# Patient Record
Sex: Female | Born: 1940 | Race: White | Hispanic: No | Smoking: Former smoker
Health system: Southern US, Community
[De-identification: ages and names within clinical notes are randomized; demographics above are authoritative.]

## PROBLEM LIST (undated history)

## (undated) DIAGNOSIS — M1990 Unspecified osteoarthritis, unspecified site: Secondary | ICD-10-CM

## (undated) DIAGNOSIS — B1910 Unspecified viral hepatitis B without hepatic coma: Secondary | ICD-10-CM

## (undated) DIAGNOSIS — I10 Essential (primary) hypertension: Secondary | ICD-10-CM

## (undated) DIAGNOSIS — K7460 Unspecified cirrhosis of liver: Secondary | ICD-10-CM

## (undated) HISTORY — PX: TOTAL HIP ARTHROPLASTY: SHX124

## (undated) HISTORY — PX: CHOLECYSTECTOMY: SHX55

---

## 2019-05-14 ENCOUNTER — Other Ambulatory Visit: Payer: Self-pay | Admitting: Family Medicine

## 2019-05-14 DIAGNOSIS — E2839 Other primary ovarian failure: Secondary | ICD-10-CM

## 2019-08-19 ENCOUNTER — Other Ambulatory Visit: Payer: Self-pay

## 2020-01-07 ENCOUNTER — Other Ambulatory Visit: Payer: Self-pay

## 2020-03-12 ENCOUNTER — Other Ambulatory Visit: Payer: Self-pay

## 2020-04-23 ENCOUNTER — Emergency Department (HOSPITAL_COMMUNITY): Payer: Medicare Other

## 2020-04-23 ENCOUNTER — Emergency Department (HOSPITAL_COMMUNITY)
Admission: EM | Admit: 2020-04-23 | Discharge: 2020-04-23 | Disposition: A | Discharge status: Home / Self Care | Payer: Medicare Other | Attending: Emergency Medicine | Admitting: Emergency Medicine

## 2020-04-23 ENCOUNTER — Encounter (HOSPITAL_COMMUNITY): Payer: Self-pay

## 2020-04-23 ENCOUNTER — Emergency Department (HOSPITAL_BASED_OUTPATIENT_CLINIC_OR_DEPARTMENT_OTHER): Payer: Medicare Other

## 2020-04-23 ENCOUNTER — Other Ambulatory Visit: Payer: Self-pay

## 2020-04-23 DIAGNOSIS — R609 Edema, unspecified: Secondary | ICD-10-CM

## 2020-04-23 DIAGNOSIS — Z87891 Personal history of nicotine dependence: Secondary | ICD-10-CM | POA: Insufficient documentation

## 2020-04-23 DIAGNOSIS — I10 Essential (primary) hypertension: Secondary | ICD-10-CM | POA: Insufficient documentation

## 2020-04-23 DIAGNOSIS — M79604 Pain in right leg: Secondary | ICD-10-CM | POA: Insufficient documentation

## 2020-04-23 DIAGNOSIS — Z96641 Presence of right artificial hip joint: Secondary | ICD-10-CM | POA: Diagnosis not present

## 2020-04-23 HISTORY — DX: Unspecified viral hepatitis B without hepatic coma: B19.10

## 2020-04-23 HISTORY — DX: Unspecified osteoarthritis, unspecified site: M19.90

## 2020-04-23 HISTORY — DX: Unspecified cirrhosis of liver: K74.60

## 2020-04-23 HISTORY — DX: Essential (primary) hypertension: I10

## 2020-04-23 NOTE — ED Triage Notes (Signed)
Patient c/o lateral right thigh pain x 4 days. Patient states that she went to her PCP yesterday and was given medication for arthritis. Patient also reports slight swelling to lateral right knee and below. Patient states, "I have pain in this one vein."

## 2020-04-23 NOTE — Discharge Instructions (Addendum)
The chest xray did not show infection.  The ultrasound did not show a blood clot.  It is possible the pain is caused from arthritis. Continue medication for primary care doctor. You can also try the over the counter pain patches or Voltaren gel for your knee pain. Follow up with primary care doctor within 1 week if you continue to have pain.  You can follow up with orthopedics and vascular if needed.  Return to the emergency department today if symptoms worsen

## 2020-04-23 NOTE — ED Provider Notes (Signed)
[facility]-EMERGENCY DEPT Provider Note   CSN: [id number] Arrival date & time: 04/23/20  1309     History Chief Complaint  Patient presents with  . Leg Pain    Melissa Fletcher is a 79 y.o. female with past medical history significant for arthritis, cirrhosis, hepatitis B, hypertension.  Takes baby aspirin daily. Surgical history includes total right hip arthroplasty in [place].  Had COVID vaccinations.  HPI Patient presents to emergency room today with chief complaint of right leg pain x4 days.  Patient states the pain has progressively worsened and became constant today.  She is describing the pain as a pressure sensation.  She states the pain is worse with ambulation. Pain radiates down her leg. She states she feels like the pain is in her veins.  She went to PCP yesterday for leg pain and was told she has arthritis and given medication for it.  She does not remember what that medication was.  She states despite taking that her symptoms have not improved.  She is rating the pain 8 of 10 in severity.  She did take Tylenol prior to arrival with symptom improvement. She denies any fall or injury to the leg. She states she had a flight to [place] x 1 month ago, no other prolonged periods of immobilization. Denies history of PE or DVT, fever, chills, cough, hemoptysis, chest pain, lower extremity edema, numbness, tingling, weakness.  Patient's daughter is at the bedside and provides additional history.  She states patient called her today and said she was having shortness of breath and difficulty breathing along with leg pain.  She was asking to be evaluated in the hospital.  Patient is denying any shortness of breath currently.  She states she was told when her pain is severe to take deep breaths and so she was doing earlier today.      Past Medical History:  Diagnosis Date  . Arthritis   . Cirrhosis (HCC)   . Hepatitis B   . Hypertension     There are no  problems to display for this patient.   Past Surgical History:  Procedure Laterality Date  . CHOLECYSTECTOMY    . TOTAL HIP ARTHROPLASTY Right      OB History   No obstetric history on file.     History reviewed. No pertinent family history.  Social History   Tobacco Use  . Smoking status: Former Games developermoker  . Smokeless tobacco: Never Used  Vaping Use  . Vaping Use: Never used  Substance Use Topics  . Alcohol use: Never  . Drug use: Never    Home Medications Prior to Admission medications   Not on File    Allergies    Patient has no known allergies.  Review of Systems   Review of Systems All other systems are reviewed and are negative for acute change except as noted in the HPI.  Physical Exam Updated Vital Signs BP (!) 179/72   Pulse (!) 59   Temp 97.9 F (36.6 C) (Oral)   Resp (!) 22   Ht 5\' 2"  (1.575 m)   Wt 80.3 kg   SpO2 92%   BMI 32.37 kg/m   Physical Exam Vitals and nursing note reviewed.  Constitutional:      General: She is not in acute distress.    Appearance: She is not ill-appearing.  HENT:     Head: Normocephalic and atraumatic.     Right Ear: Tympanic membrane and external ear normal.  Left Ear: Tympanic membrane and external ear normal.     Nose: Nose normal.     Mouth/Throat:     Mouth: Mucous membranes are moist.     Pharynx: Oropharynx is clear.  Eyes:     General: No scleral icterus.       Right eye: No discharge.        Left eye: No discharge.     Extraocular Movements: Extraocular movements intact.     Conjunctiva/sclera: Conjunctivae normal.     Pupils: Pupils are equal, round, and reactive to light.  Neck:     Vascular: No JVD.  Cardiovascular:     Rate and Rhythm: Normal rate and regular rhythm.     Pulses: Normal pulses.          Radial pulses are 2+ on the right side and 2+ on the left side.       Dorsalis pedis pulses are 2+ on the right side and 2+ on the left side.     Heart sounds: Normal heart sounds.    Pulmonary:     Comments: Lungs clear to auscultation in all fields. Symmetric chest rise. No wheezing, rales, or rhonchi.  Respiratory rate 20 during exam Abdominal:     Comments: Abdomen is soft, non-distended, and non-tender in all quadrants. No rigidity, no guarding. No peritoneal signs.  Musculoskeletal:        General: Normal range of motion.     Cervical back: Normal range of motion.     Right lower leg: No edema.     Left lower leg: No edema.     Comments: Tender to palpation over lateral joint line. No deformity of right lower extremity.   Skin:    General: Skin is warm and dry.     Capillary Refill: Capillary refill takes less than 2 seconds.     Comments: Equal tactile temperature to all extremities  Neurological:     Mental Status: She is oriented to person, place, and time.     GCS: GCS eye subscore is 4. GCS verbal subscore is 5. GCS motor subscore is 6.     Comments: Fluent speech, no facial droop.  Psychiatric:        Behavior: Behavior normal.     ED Results / Procedures / Treatments   Labs (all labs ordered are listed, but only abnormal results are displayed) Labs Reviewed - No data to display  EKG None  Radiology DG Chest Portable 1 View  Result Date: 04/23/2020 CLINICAL DATA:  Shortness of breath.  Right thigh pain. EXAM: PORTABLE CHEST 1 VIEW COMPARISON:  None. FINDINGS: The heart size and mediastinal contours are within normal limits. Both lungs are clear. The visualized skeletal structures are unremarkable. IMPRESSION: No active disease. Electronically Signed   By: Gerome Sam III M.D   On: 04/23/2020 16:27   VAS [place] LOWER EXTREMITY VENOUS (DVT) (ONLY [facility] & [facility])  Result Date: 04/23/2020  Lower Venous DVT Study Indications: Edema.  Comparison Study: no prior Performing Technologist: Blanch Media RVS  Examination Guidelines: A complete evaluation includes B-mode imaging, spectral Doppler, color Doppler, and power Doppler as needed of all accessible  portions of each vessel. Bilateral testing is considered an integral part of a complete examination. Limited examinations for reoccurring indications may be performed as noted. The reflux portion of the exam is performed with the patient in reverse Trendelenburg.  +---------+---------------+---------+-----------+----------+--------------+ RIGHT    CompressibilityPhasicitySpontaneityPropertiesThrombus Aging +---------+---------------+---------+-----------+----------+--------------+ CFV      Full  Yes      Yes                                 +---------+---------------+---------+-----------+----------+--------------+ SFJ      Full                                                        +---------+---------------+---------+-----------+----------+--------------+ FV Prox  Full                                                        +---------+---------------+---------+-----------+----------+--------------+ FV Mid   Full                                                        +---------+---------------+---------+-----------+----------+--------------+ FV DistalFull                                                        +---------+---------------+---------+-----------+----------+--------------+ PFV      Full                                                        +---------+---------------+---------+-----------+----------+--------------+ POP      Full           Yes      Yes                                 +---------+---------------+---------+-----------+----------+--------------+ PTV      Full                                                        +---------+---------------+---------+-----------+----------+--------------+ PERO     Full                                                        +---------+---------------+---------+-----------+----------+--------------+   +----+---------------+---------+-----------+----------+--------------+  LEFTCompressibilityPhasicitySpontaneityPropertiesThrombus Aging +----+---------------+---------+-----------+----------+--------------+ CFV Full           Yes      Yes                                 +----+---------------+---------+-----------+----------+--------------+     Summary: RIGHT: - There is no evidence of deep vein  thrombosis in the lower extremity.  - No cystic structure found in the popliteal fossa.  LEFT: - No evidence of common femoral vein obstruction.  *See table(s) above for measurements and observations.    Preliminary     Procedures Procedures (including critical care time)  Medications Ordered in ED Medications - No data to display  ED Course  I have reviewed the triage vital signs and the nursing notes.  Pertinent labs & imaging results that were available during my care of the patient were reviewed by me and considered in my medical decision making (see chart for details).    MDM Rules/Calculators/A&P                          History provided by patient and daughter  with additional history obtained from chart review.    79 yo female presenting with right leg pain without known injury. Afebrile, HDS, noted to be tachypneic in triage with RR 32 documented.  On my exam she is well appearing, in no acute distress. RR 20, no tachypnea and lungs CTAB. She has no edema noted to RLE, is ambulatory with normal gait. No deformity of leg, tender to palpation to lateral joint line. Looks to have varicose veins on RLE. DP pulse 2+ bilaterally. Brisk cap refill. I viewed pt's chest xray and it does not suggest acute infectious processes.  Ultrasound is negative for DVT.  Discussed results with patient and daughter. Vitals checked again and she continues to have normal respiratory rate. Recommend she follow up with ortho and vascular for varicose veins if she continues to have pain. Also recommend pcp follow up within 1 week.  The patient appears reasonably screened  and/or stabilized for discharge and I doubt any other medical condition or other [facility] requiring further screening, evaluation, or treatment in the ED at this time prior to discharge. The patient is safe for discharge with strict return precautions discussed. The patient was discussed with and seen by Dr. Madilyn Hook who agrees with the treatment plan.   Portions of this note were generated with Scientist, clinical (histocompatibility and immunogenetics). Dictation errors may occur despite best attempts at proofreading.   Final Clinical Impression(s) / ED Diagnoses Final diagnoses:  Right leg pain    Rx / DC Orders ED Discharge Orders    None       Kandice Hams 04/23/20 1736    Tilden Fossa, MD 04/26/20 [phone]

## 2020-04-23 NOTE — Progress Notes (Signed)
Lower extremity venous has been completed.   Preliminary results in CV Proc.   Blanch Media 04/23/2020 4:34 PM

## 2020-09-23 ENCOUNTER — Emergency Department (HOSPITAL_COMMUNITY): Payer: Medicare Other

## 2020-09-23 ENCOUNTER — Other Ambulatory Visit: Payer: Self-pay

## 2020-09-23 ENCOUNTER — Emergency Department (HOSPITAL_COMMUNITY)
Admission: EM | Admit: 2020-09-23 | Discharge: 2020-09-23 | Disposition: A | Discharge status: Home / Self Care | Payer: Medicare Other | Attending: Emergency Medicine | Admitting: Emergency Medicine

## 2020-09-23 ENCOUNTER — Encounter (HOSPITAL_COMMUNITY): Payer: Self-pay

## 2020-09-23 DIAGNOSIS — I10 Essential (primary) hypertension: Secondary | ICD-10-CM | POA: Insufficient documentation

## 2020-09-23 DIAGNOSIS — Z96641 Presence of right artificial hip joint: Secondary | ICD-10-CM | POA: Insufficient documentation

## 2020-09-23 DIAGNOSIS — M542 Cervicalgia: Secondary | ICD-10-CM | POA: Diagnosis not present

## 2020-09-23 DIAGNOSIS — Z87891 Personal history of nicotine dependence: Secondary | ICD-10-CM | POA: Insufficient documentation

## 2020-09-23 LAB — CBC WITH DIFFERENTIAL/PLATELET
Abs Immature Granulocytes: 0.03 10*3/uL (ref 0.00–0.07)
Basophils Absolute: 0 10*3/uL (ref 0.0–0.1)
Basophils Relative: 1 %
Eosinophils Absolute: 0.2 10*3/uL (ref 0.0–0.5)
Eosinophils Relative: 3 %
HCT: 38.2 % (ref 36.0–46.0)
Hemoglobin: 12.4 g/dL (ref 12.0–15.0)
Immature Granulocytes: 0 %
Lymphocytes Relative: 17 %
Lymphs Abs: 1.1 10*3/uL (ref 0.7–4.0)
MCH: 30.3 pg (ref 26.0–34.0)
MCHC: 32.5 g/dL (ref 30.0–36.0)
MCV: 93.4 fL (ref 80.0–100.0)
Monocytes Absolute: 0.9 10*3/uL (ref 0.1–1.0)
Monocytes Relative: 13 %
Neutro Abs: 4.5 10*3/uL (ref 1.7–7.7)
Neutrophils Relative %: 66 %
Platelets: 115 10*3/uL — ABNORMAL LOW (ref 150–400)
RBC: 4.09 MIL/uL (ref 3.87–5.11)
RDW: 12.5 % (ref 11.5–15.5)
WBC: 6.8 10*3/uL (ref 4.0–10.5)
nRBC: 0 % (ref 0.0–0.2)

## 2020-09-23 LAB — URINALYSIS, ROUTINE W REFLEX MICROSCOPIC
Bilirubin Urine: NEGATIVE
Glucose, UA: NEGATIVE mg/dL
Ketones, ur: NEGATIVE mg/dL
Nitrite: NEGATIVE
Protein, ur: NEGATIVE mg/dL
Specific Gravity, Urine: 1.01 (ref 1.005–1.030)
pH: 6 (ref 5.0–8.0)

## 2020-09-23 LAB — COMPREHENSIVE METABOLIC PANEL
ALT: 15 U/L (ref 0–44)
AST: 29 U/L (ref 15–41)
Albumin: 3.8 g/dL (ref 3.5–5.0)
Alkaline Phosphatase: 93 U/L (ref 38–126)
Anion gap: 7 (ref 5–15)
BUN: 11 mg/dL (ref 8–23)
CO2: 28 mmol/L (ref 22–32)
Calcium: 10.4 mg/dL — ABNORMAL HIGH (ref 8.9–10.3)
Chloride: 101 mmol/L (ref 98–111)
Creatinine, Ser: 0.8 mg/dL (ref 0.44–1.00)
GFR, Estimated: >60 mL/min (ref >60)
Glucose, Bld: 112 mg/dL — ABNORMAL HIGH (ref 70–99)
Potassium: 3.9 mmol/L (ref 3.5–5.1)
Sodium: 136 mmol/L (ref 135–145)
Total Bilirubin: 1 mg/dL (ref 0.3–1.2)
Total Protein: 8.6 g/dL — ABNORMAL HIGH (ref 6.5–8.1)

## 2020-09-23 LAB — TROPONIN I (HIGH SENSITIVITY)
Troponin I (High Sensitivity): 6 ng/L (ref <18)
Troponin I (High Sensitivity): 10 ng/L (ref <18)

## 2020-09-23 MED ORDER — DIPHENHYDRAMINE HCL 50 MG/ML IJ SOLN
12.5000 mg | Freq: Once | INTRAMUSCULAR | Status: AC
  Administered 2020-09-23: 12.5 mg via INTRAVENOUS
  Filled 2020-09-23: qty 1

## 2020-09-23 MED ORDER — MORPHINE SULFATE (PF) 4 MG/ML IV SOLN
4.0000 mg | Freq: Once | INTRAVENOUS | Status: AC
  Administered 2020-09-23: 4 mg via INTRAVENOUS
  Filled 2020-09-23: qty 1

## 2020-09-23 MED ORDER — LIDOCAINE 5 % EX PTCH: 1.0000 | MEDICATED_PATCH | CUTANEOUS | 0 refills | Status: DC

## 2020-09-23 MED ORDER — HYDRALAZINE HCL 20 MG/ML IJ SOLN
5.0000 mg | Freq: Once | INTRAMUSCULAR | Status: AC
  Administered 2020-09-23: 5 mg via INTRAVENOUS
  Filled 2020-09-23: qty 1

## 2020-09-23 MED ORDER — METHOCARBAMOL 500 MG PO TABS: 500.0000 mg | ORAL_TABLET | Freq: Two times a day (BID) | ORAL | 0 refills | Status: DC

## 2020-09-23 MED ORDER — IOHEXOL 350 MG/ML SOLN
75.0000 mL | Freq: Once | INTRAVENOUS | Status: AC | PRN
  Administered 2020-09-23: 75 mL via INTRAVENOUS

## 2020-09-23 MED ORDER — METOCLOPRAMIDE HCL 5 MG/ML IJ SOLN
10.0000 mg | Freq: Once | INTRAMUSCULAR | Status: AC
  Administered 2020-09-23: 10 mg via INTRAVENOUS
  Filled 2020-09-23: qty 2

## 2020-09-23 NOTE — ED Notes (Signed)
Lab notified and aware of Troponin add-on

## 2020-09-23 NOTE — ED Provider Notes (Signed)
[facility]-EMERGENCY DEPT Provider Note   CSN: [id number] Arrival date & time: 09/23/20  1427     History Chief Complaint  Patient presents with  . Shoulder Pain  . Back Pain  . Headache    Melissa Fletcher is a 80 y.o. female with PMH of HTN, cirrhosis, and arthritis presents to the ED with complaints of atraumatic left trapezial and left-sided neck pain.  On my examination, patient reports that she felt peripherally fine when she went to bed Sunday evening.  However, on Monday morning she woke up with severe left-sided neck throbbing "pressure" pain that has been constant.  She recently moved here from [place] 4 years ago.  She states that she had been previously diagnosed with a "bruised vessel" near her cervical spine when she had a CT head and neck obtained.    She states that she has diminished ability and pain with hyper extension of the neck.  She is still able to flex her neck forward and has relatively normal range of motion twisting her head in either direction.  She describes it as being in her trapezial/left cervical region and it radiates up the left side of her face and head posteriorly.  Her discomfort is worse with certain movements of the head, most notably with the hyperextension as well as with leftward (ipsilateral) gaze.  She has been trying to treat her symptoms with heating pads to the affected area, with no significant relief.  She denies any obvious precipitating trauma, fevers or chills, blurred vision or diplopia, numbness or weakness, room spinning dizziness, gait disturbance, recent falls, or any other symptoms.  Patient is left-hand dominant.  Denies any increased activity this weekend that could have precipitated her symptoms.  HPI     Past Medical History:  Diagnosis Date  . Arthritis   . Cirrhosis (HCC)   . Hepatitis B   . Hypertension     There are no problems to display for this patient.   Past Surgical History:   Procedure Laterality Date  . CHOLECYSTECTOMY    . TOTAL HIP ARTHROPLASTY Right      OB History   No obstetric history on file.     History reviewed. No pertinent family history.  Social History   Tobacco Use  . Smoking status: Former Games developer  . Smokeless tobacco: Never Used  Vaping Use  . Vaping Use: Never used  Substance Use Topics  . Alcohol use: Never  . Drug use: Never    Home Medications Prior to Admission medications   Not on File    Allergies    Patient has no known allergies.  Review of Systems   Review of Systems  All other systems reviewed and are negative.   Physical Exam Updated Vital Signs BP (!) 166/74 (BP Location: Left Arm)   Pulse 77   Temp 98.6 F (37 C) (Oral)   Resp 12   Ht 5\' 3"  (1.6 m)   Wt 74.8 kg   SpO2 98%   BMI 29.23 kg/m   Physical Exam Vitals and nursing note reviewed. Exam conducted with a chaperone present.  Constitutional:      Appearance: Normal appearance.  HENT:     Head: Normocephalic and atraumatic.  Eyes:     General: No scleral icterus.    Extraocular Movements: Extraocular movements intact.     Conjunctiva/sclera: Conjunctivae normal.     Pupils: Pupils are equal, round, and reactive to light.  Neck:  Comments: ROM limited.  Flexion intact.  Extension limited due to pain.  Left-sided neck discomfort also elicited with leftward gaze.  No significant discomfort with rightward gaze. Cardiovascular:     Rate and Rhythm: Normal rate.  Pulmonary:     Effort: Pulmonary effort is normal.  Musculoskeletal:     Cervical back: Tenderness present.  Skin:    General: Skin is dry.     Findings: No rash.     Comments: No overlying skin changes.  Neurological:     General: No focal deficit present.     Mental Status: She is alert and oriented to person, place, and time.     GCS: GCS eye subscore is 4. GCS verbal subscore is 5. GCS motor subscore is 6.     Cranial Nerves: No cranial nerve deficit.     Sensory: No  sensory deficit.     Motor: No weakness.     Coordination: Coordination normal.     Gait: Gait normal.     Comments: CN II through XII grossly intact.  Moves all extremities with strength intact against resistance.  No ataxia.  No gait disturbance.  Sensation intact and symmetric  Psychiatric:        Mood and Affect: Mood normal.        Behavior: Behavior normal.        Thought Content: Thought content normal.     ED Results / Procedures / Treatments   Labs (all labs ordered are listed, but only abnormal results are displayed) Labs Reviewed  COMPREHENSIVE METABOLIC PANEL - Abnormal; Notable for the following components:      Result Value   Glucose, Bld 112 (*)    Calcium 10.4 (*)    Total Protein 8.6 (*)    All other components within normal limits  CBC WITH DIFFERENTIAL/PLATELET  URINALYSIS, ROUTINE W REFLEX MICROSCOPIC  TROPONIN I (HIGH SENSITIVITY)    EKG None  Radiology DG Chest Port 1 View  Result Date: 09/23/2020 CLINICAL DATA:  Lambert Mody left-sided chest pain EXAM: PORTABLE CHEST 1 VIEW COMPARISON:  04/23/2020 FINDINGS: No focal consolidation or effusion. Cardiomediastinal silhouette within normal limits allowing for rotation and portable technique. Aortic atherosclerosis. Possible bronchitic changes at the bases IMPRESSION: No active disease.  Suspect mild bronchitic changes at the bases Electronically Signed   By: Jasmine Pang M.D.   On: 09/23/2020 17:27   DG Shoulder Left  Result Date: 09/23/2020 CLINICAL DATA:  Left upper back becoming left shoulder pain. No reported injury. EXAM: LEFT SHOULDER - 2+ VIEW COMPARISON:  None. FINDINGS: No fracture or bone lesion. Narrowed AC joint with small marginal spurs. Glenohumeral joint is normally spaced and aligned. Skeletal structures are demineralized. Soft tissues are unremarkable. IMPRESSION: 1. No fracture or acute finding.  No bone lesion. 2. Mild AC joint osteoarthritis. Electronically Signed   By: Amie Portland M.D.   On:  09/23/2020 16:16    Procedures Procedures   Medications Ordered in ED Medications  hydrALAZINE (APRESOLINE) injection 5 mg (has no administration in time range)  morphine 4 MG/ML injection 4 mg (has no administration in time range)  metoCLOPramide (REGLAN) injection 10 mg (has no administration in time range)  diphenhydrAMINE (BENADRYL) injection 12.5 mg (has no administration in time range)    ED Course  I have reviewed the triage vital signs and the nursing notes.  Pertinent labs & imaging results that were available during my care of the patient were reviewed by me and considered in my medical  decision making (see chart for details).    MDM Rules/Calculators/A&P                          Lucretia Pendley was evaluated in Emergency Department on 09/23/2020 for the symptoms described in the history of present illness. She was evaluated in the context of the global COVID-19 pandemic, which necessitated consideration that the patient might be at risk for infection with the SARS-CoV-2 virus that causes COVID-19. Institutional protocols and algorithms that pertain to the evaluation of patients at risk for COVID-19 are in a state of rapid change based on information released by regulatory bodies including the CDC and federal and state organizations. These policies and algorithms were followed during the patient's care in the ED.  I personally reviewed patient's medical chart and all notes from triage and staff during today's encounter. I have also ordered and reviewed all labs and imaging that I felt to be medically necessary in the evaluation of this patient's complaints and with consideration of their physical exam. If needed, translation services were available and utilized.   Patient with nonspecific left-sided trapezial/neck pain that radiates into posterior aspect of head.  She describes possible "bruised vessel" in cervical area obtained on CT head and neck in the past.  Will obtain  cardiac work-up and basic labs.  Considered segmentation rate/CRP, but GCA/temporal arteritis less likely given lack of any visual deficits.  Plain films obtained of left shoulder demonstrate no acute osseous abnormalities.  We will proceed with cardiac work-up including EKG, chest x-ray, and trend troponin x2.  We will also proceed with CTA head and neck.  Migraine cocktail ordered including Benadryl, Reglan, and morphine.  Hydralazine ordered in an effort to improve her blood pressure given concern for possible carotid dissection.  At shift change care was transferred to Arthor Captain, PA-C who will follow pending studies, re-evaluate, and determine disposition.  If work-up is negative, suspect musculoskeletal and she can be discharged home with conservative management +/-  muscle relaxants.    Final Clinical Impression(s) / ED Diagnoses Final diagnoses:  Neck pain on left side    Rx / DC Orders ED Discharge Orders    None       Lorelee New, PA-C 09/23/20 1759    Charlynne Pander, MD 09/25/20 [phone]

## 2020-09-23 NOTE — ED Provider Notes (Signed)
80 year old female here with neck pain.  Currently receiving ACS work-up with troponins x2, CT angio neck pending.  If negative likely musculoskeletal.   Patient's work-up finished.  2 negative troponins, I personally reviewed images of the CTA of the neck which showed no acute abnormalities.  I have discussed all findings with the patient.  Upon my exam of the patient she has very tight, increased tone, tenderness along the left scalene and levator scapula.  Patient be discharged with lidocaine patches and Robaxin appears otherwise appropriate for discharge at this time with PCP follow-up   Arthor Captain, PA-C 09/23/20 2236    Charlynne Pander, MD 09/25/20 1515

## 2020-09-23 NOTE — Discharge Instructions (Signed)
Return for any new or worsening symptoms.

## 2020-09-23 NOTE — ED Notes (Signed)
PA-C at the bedside to evaluate.  

## 2020-09-23 NOTE — ED Triage Notes (Signed)
Patient reports that she began having left upper back pain and is now experiencing left shoulder and left pain in the side of her head as well

## 2020-09-23 NOTE — ED Notes (Signed)
Patient transported to CT 

## 2020-09-23 NOTE — ED Provider Notes (Addendum)
Emergency Medicine Provider Triage Evaluation Note  Melissa Fletcher , a 80 y.o. female  was evaluated in triage.  Pt complains of back pain and left shoulder pain.  Pain started on Monday morning.  Pain is intermittent.  Pain moves from left shoulder into her neck and head.  Has had some intermittent headaches.  Denies any falls or injuries.     Left hand dominant.    Review of Systems  Positive: Left shoulder, pain, headaches Negative: Chest pain, shob, headaches, dizziness, facial asymmetry, numbness, weakness, facial asymmetry, slurred speech,  Physical Exam  BP (!) 161/80 (BP Location: Left Arm)   Pulse 70   Temp 98.7 F (37.1 C) (Oral)   Resp 17   Ht 5\' 3"  (1.6 m)   Wt 74.8 kg   SpO2 94%   BMI 29.23 kg/m  Gen:   Awake, no distress   Resp:  Normal effort  MSK:   Moves extremities without difficulty, tenderness to right trapezius muscle Other:    Medical Decision Making  Medically screening exam initiated at 3:25 PM.  Appropriate orders placed.  Shila Kruczek was informed that the remainder of the evaluation will be completed by another provider, this initial triage assessment does not replace that evaluation, and the importance of remaining in the ED until their evaluation is complete.  The patient appears stable so that the remainder of the work up may be completed by another provider.      Johnney Killian, PA-C 09/23/20 1533    11/23/20, PA-C 09/23/20 1534    11/23/20, MD 09/25/20 1536

## 2021-01-06 ENCOUNTER — Other Ambulatory Visit: Payer: Self-pay | Admitting: Family Medicine

## 2021-01-06 DIAGNOSIS — K7460 Unspecified cirrhosis of liver: Secondary | ICD-10-CM

## 2021-02-07 ENCOUNTER — Ambulatory Visit
Admission: RE | Admit: 2021-02-07 | Discharge: 2021-02-07 | Disposition: A | Discharge status: Home / Self Care | Payer: Medicare Other | Source: Ambulatory Visit | Attending: Orthopedic Surgery | Admitting: Orthopedic Surgery

## 2021-02-07 ENCOUNTER — Other Ambulatory Visit: Payer: Self-pay | Admitting: Orthopedic Surgery

## 2021-02-07 ENCOUNTER — Other Ambulatory Visit: Payer: Self-pay

## 2021-02-07 ENCOUNTER — Encounter: Payer: Self-pay | Admitting: Orthopedic Surgery

## 2021-02-07 ENCOUNTER — Ambulatory Visit (INDEPENDENT_AMBULATORY_CARE_PROVIDER_SITE_OTHER): Payer: Medicare Other | Admitting: Orthopedic Surgery

## 2021-02-07 ENCOUNTER — Other Ambulatory Visit: Payer: Medicare Other

## 2021-02-07 VITALS — BP 112/80 | HR 65 | Temp 96.2°F | Ht 63.0 in | Wt 166.4 lb

## 2021-02-07 DIAGNOSIS — M25551 Pain in right hip: Secondary | ICD-10-CM | POA: Diagnosis not present

## 2021-02-07 DIAGNOSIS — K7460 Unspecified cirrhosis of liver: Secondary | ICD-10-CM

## 2021-02-07 DIAGNOSIS — M25561 Pain in right knee: Secondary | ICD-10-CM | POA: Diagnosis not present

## 2021-02-07 DIAGNOSIS — Z85828 Personal history of other malignant neoplasm of skin: Secondary | ICD-10-CM

## 2021-02-07 DIAGNOSIS — I10 Essential (primary) hypertension: Secondary | ICD-10-CM

## 2021-02-07 DIAGNOSIS — H903 Sensorineural hearing loss, bilateral: Secondary | ICD-10-CM

## 2021-02-07 DIAGNOSIS — Z780 Asymptomatic menopausal state: Secondary | ICD-10-CM | POA: Diagnosis not present

## 2021-02-07 DIAGNOSIS — R413 Other amnesia: Secondary | ICD-10-CM

## 2021-02-07 DIAGNOSIS — I700 Atherosclerosis of aorta: Secondary | ICD-10-CM

## 2021-02-07 DIAGNOSIS — D696 Thrombocytopenia, unspecified: Secondary | ICD-10-CM

## 2021-02-07 NOTE — Patient Instructions (Signed)
Voltaren gel 1 %- topical for knee/ hip pain- use 3-4 x/ day  B[facility] with [facility]  [facility]- xray to knee and hip

## 2021-02-07 NOTE — Progress Notes (Signed)
Careteam: Patient Care Team: Octavia Heir, NP as PCP - General (Adult Health Nurse Practitioner)  Seen by: [name], AGNP-C  PLACE OF SERVICE:  [facility]  Advanced Directive information Does Patient Have a Medical Advance Directive?: No  No Known Allergies  Chief Complaint  Patient presents with   Establish Care    New patient to establish care. Pain in right leg from knee to upper hip area. Pain is worse when laying down.     HPI: Patient is a 80 y.o. female seen today to establish at [facility].   Previous provider Dr. Chanetta Marshall. Medical records requested. Originally, from [place], she moved to [place] about 2 years ago. Retired caregiver for elderly patients. Widowed. Has 2 daughters. She is a independent resident at [employer].   Daughter present for encounter.   Hypertension- does not know it it started, she takes atenolol and valsartan daily, she avoids salt in her doet, does not check pressure at home. Cardiologist recommended taking aspirin about 8 years ago  Liver cirrhosis- started in 2016 when she lived in [place]. Postoperative report for lap cholecystectomy revealed severe cirrhosis with nodularities throughout left and right lobes. Reports elevated liver enzymes at that time. 09/2020 AST/ALT 29/15.   Right knee pain/ hip pain- right knee pain has increased within past month. Pain radiated from right hip to knee. She reports having xrays in the past and been told she has severe arthritis. Right hip replaced about 8-10 years ago. No recent falls, ambulates on her own. Uses Tramadol prn for pain.   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    HOH- bilateral hearing aids for about 10-12 years. Followed by audiologist, cannot remember name.   Memory- denies memory issues. Daughter reports issues with short term memory. She has had difficulty managing finances, daughter handles them now. She has never had a MMSE done.   No recent  hospitalizations or injuries.   Family history:  Father- deceased- cancer- passed in his 40's  Mother- deceased- stroke- passed in 61's  She had 12 siblings- they have all deceased- chronic illnesses not reported  Colonoscopy- last done about 15 years ago Mammogram- not done in years, no family hx of breast cancer  DEXA- refused last year, would like referral today Eye exam- goes to [facility] yearly, went this year Dental exam- followed by [facility], next exam tomorrow  Smoke- smoked about 1 pack daily for 40 years, quit around 2000.  Drinking- not often, only for special occassions.  Drugs- no prior drug use  Appetite- eats 3 meals daily, snacks often. Drinks coffee/tea daily.  Exercise- walks short trips but does not have a scheduled exercise plan.   Does not have advanced directives. Packet given to daughter.      Review of Systems:  Review of Systems  Constitutional:  Negative for chills, fever and malaise/fatigue.  HENT:  Positive for hearing loss. Negative for congestion and sore throat.   Eyes:        Glasses  Respiratory:  Negative for cough, shortness of breath and wheezing.   Cardiovascular:  Negative for chest pain and leg swelling.  Gastrointestinal:  Negative for abdominal pain, blood in stool, constipation, diarrhea, heartburn, nausea and vomiting.  Genitourinary:  Positive for urgency. Negative for dysuria and hematuria.  Musculoskeletal:  Positive for joint pain and myalgias. Negative for falls.  Neurological:  Negative for dizziness, tingling and seizures.  Psychiatric/Behavioral:  Positive for memory loss. Negative  for depression. The patient is not nervous/anxious and does not have insomnia.    Past Medical History:  Diagnosis Date   Arthritis    Cirrhosis (HCC)    Hepatitis B    Hypertension    Past Surgical History:  Procedure Laterality Date   CHOLECYSTECTOMY     TOTAL HIP ARTHROPLASTY Right    Social History:   reports that she  quit smoking about 22 years ago. Her smoking use included cigarettes. She has a 20.00 pack-year smoking history. She has never used smokeless tobacco. She reports that she does not drink alcohol and does not use drugs.  Family History  Problem Relation Age of Onset   Stroke Mother    Cancer Father    Healthy Daughter    Healthy Daughter    Healthy Son     Medications: Patient's Medications  New Prescriptions   No medications on file  Previous Medications   ASPIRIN EC 81 MG TABLET    Take 81 mg by mouth daily. Swallow whole.   ATENOLOL (TENORMIN) 50 MG TABLET    Take by mouth.   CHOLECALCIFEROL (VITAMIN D3 PO)    Take 10 mg by mouth.   LIDOCAINE (LIDODERM) 5 %    Place 1 patch onto the skin daily. Remove & Discard patch within 12 hours or as directed by MD   TRAMADOL (ULTRAM) 50 MG TABLET    Take 50 mg by mouth 2 (two) times daily as needed.   VALSARTAN (DIOVAN) 160 MG TABLET    Take 160 mg by mouth daily.  Modified Medications   No medications on file  Discontinued Medications   METHOCARBAMOL (ROBAXIN) 500 MG TABLET    Take 1 tablet (500 mg total) by mouth 2 (two) times daily.    Physical Exam:  Vitals:   02/07/21 1338  BP: 112/80  Pulse: 65  Temp: (!) 96.2 F (35.7 C)  SpO2: 96%  Weight: 166 lb 6.4 oz (75.5 kg)  Height: 5\' 3"  (1.6 m)   Body mass index is 29.48 kg/m. Wt Readings from Last 3 Encounters:  02/07/21 166 lb 6.4 oz (75.5 kg)  09/23/20 165 lb (74.8 kg)  04/23/20 177 lb (80.3 kg)    Physical Exam Vitals reviewed.  Constitutional:      General: She is not in acute distress.    Appearance: She is not ill-appearing or toxic-appearing.  HENT:     Head: Normocephalic.  Eyes:     General:        Right eye: No discharge.        Left eye: No discharge.  Neck:     Vascular: No carotid bruit.  Cardiovascular:     Rate and Rhythm: Normal rate and regular rhythm.     Pulses: Normal pulses.     Heart sounds: Normal heart sounds. No murmur heard. Pulmonary:      Effort: Pulmonary effort is normal. No respiratory distress.     Breath sounds: Normal breath sounds. No wheezing.  Abdominal:     General: Bowel sounds are normal. There is no distension.     Palpations: Abdomen is soft.     Tenderness: There is no abdominal tenderness.  Musculoskeletal:     Cervical back: Normal range of motion.     Right hip: No deformity, tenderness or crepitus. Normal range of motion. Normal strength.     Right knee: No swelling, deformity or erythema. Normal range of motion. Tenderness present over the lateral joint line.  Right lower leg: No edema.     Left lower leg: No edema.     Comments: Surgical scar over right lateral hip.   Lymphadenopathy:     Cervical: No cervical adenopathy.  Skin:    General: Skin is warm and dry.     Capillary Refill: Capillary refill takes less than 2 seconds.  Neurological:     General: No focal deficit present.     Mental Status: She is alert and oriented to person, place, and time.     Motor: Weakness present.     Gait: Gait abnormal.  Psychiatric:        Mood and Affect: Mood normal.        Behavior: Behavior normal.    Labs reviewed: Basic Metabolic Panel: Recent Labs    09/23/20 1641  NA 136  K 3.9  CL 101  CO2 28  GLUCOSE 112*  BUN 11  CREATININE 0.80  CALCIUM 10.4*   Liver Function Tests: Recent Labs    09/23/20 1641  AST 29  ALT 15  ALKPHOS 93  BILITOT 1.0  PROT 8.6*  ALBUMIN 3.8   No results for input(s): LIPASE, AMYLASE in the last 8760 hours. No results for input(s): AMMONIA in the last 8760 hours. CBC: Recent Labs    09/23/20 1641  WBC 6.8  NEUTROABS 4.5  HGB 12.4  HCT 38.2  MCV 93.4  PLT 115*   Lipid Panel: No results for input(s): CHOL, HDL, LDLCALC, TRIG, CHOLHDL, LDLDIRECT in the last 8760 hours. TSH: No results for input(s): TSH in the last 8760 hours. A1C: No results found for: HGBA1C   Assessment/Plan 1. Postmenopausal - last bone density unknown - DG Bone  Density; Future  2. Right hip pain - right hip replacement 8-10 years ago - reports pain radiation to right knee - DG Hip Unilat W OR W/O Pelvis 2-3 Views Right; Future  3. Acute pain of right knee - increased pain within past month - cont tramadol 50 mg po bid prn for pain - recommend voltaren gel 1 %- apply to right knee tid prn - DG Knee Complete 4 Views Right; Future  4. Primary hypertension -BUN/creat 17/0.76 02/07/2021 - controlled - cont atenolol and valsartan - CMP - CBC with Differential/Platelet  5. Sensorineural hearing loss (SNHL) of both ears - [facility] by [facility] - cont bilateral hearing aids  6. Cirrhosis of liver without ascites, unspecified hepatic cirrhosis type (HCC) - 2016, noted during lap cholecystectomy- nodularities observed on left and right lobes - AST/ALT 26/13 02/07/2021 - platelets 108 02/07/2021 - referral to GI- will discuss next visit - cbc/diff- future  7. History of skin cancer - forehead- does not remember when   8. Impaired memory - daughter reports issues with short term memory - MMSE-future  9. Aortic atherosclerosis (HCC) - CXR 09/2020 - she has been taking daily aspirin  10. Hypercalcemia - Calcium 10.7 02/07/2021, 10.4 09/2020 - PTH- future - cmp- future  11. Thrombocytopenia - platelet count 108 02/07/2021, 115 09/2020 - d/c aspirin   Total time: 52 minutes. Greater than 50% of total time spent doing patient education regarding health maintenance and symptom management.   Futurelabs/tests: cbc/diff, cmp, PTH, MMSE, discuss GI referral  Next appt: [address]  Kyeisha Janowicz Scherry Ran  [facility] [phone]

## 2021-02-08 LAB — COMPREHENSIVE METABOLIC PANEL
AG Ratio: 1 (calc) (ref 1.0–2.5)
ALT: 13 U/L (ref 6–29)
AST: 26 U/L (ref 10–35)
Albumin: 3.9 g/dL (ref 3.6–5.1)
Alkaline phosphatase (APISO): 100 U/L (ref 37–153)
BUN: 17 mg/dL (ref 7–25)
CO2: 29 mmol/L (ref 20–32)
Calcium: 10.7 mg/dL — ABNORMAL HIGH (ref 8.6–10.4)
Chloride: 103 mmol/L (ref 98–110)
Creat: 0.76 mg/dL (ref 0.60–0.95)
Globulin: 4 g/dL (calc) — ABNORMAL HIGH (ref 1.9–3.7)
Glucose, Bld: 85 mg/dL (ref 65–99)
Potassium: 4.7 mmol/L (ref 3.5–5.3)
Sodium: 138 mmol/L (ref 135–146)
Total Bilirubin: 0.7 mg/dL (ref 0.2–1.2)
Total Protein: 7.9 g/dL (ref 6.1–8.1)

## 2021-02-08 LAB — CBC WITH DIFFERENTIAL/PLATELET
Absolute Monocytes: 466 cells/uL (ref 200–950)
Basophils Absolute: 48 cells/uL (ref 0–200)
Basophils Relative: 1.3 %
Eosinophils Absolute: 189 cells/uL (ref 15–500)
Eosinophils Relative: 5.1 %
HCT: 37.6 % (ref 35.0–45.0)
Hemoglobin: 12.1 g/dL (ref 11.7–15.5)
Lymphs Abs: 981 cells/uL (ref 850–3900)
MCH: 30 pg (ref 27.0–33.0)
MCHC: 32.2 g/dL (ref 32.0–36.0)
MCV: 93.1 fL (ref 80.0–100.0)
MPV: 10.9 fL (ref 7.5–12.5)
Monocytes Relative: 12.6 %
Neutro Abs: 2017 cells/uL (ref 1500–7800)
Neutrophils Relative %: 54.5 %
Platelets: 108 10*3/uL — ABNORMAL LOW (ref 140–400)
RBC: 4.04 10*6/uL (ref 3.80–5.10)
RDW: 12.4 % (ref 11.0–15.0)
Total Lymphocyte: 26.5 %
WBC: 3.7 10*3/uL — ABNORMAL LOW (ref 3.8–10.8)

## 2021-02-09 ENCOUNTER — Other Ambulatory Visit: Payer: Self-pay | Admitting: Orthopedic Surgery

## 2021-02-11 ENCOUNTER — Other Ambulatory Visit: Payer: Self-pay | Admitting: Orthopedic Surgery

## 2021-02-11 ENCOUNTER — Telehealth: Payer: Self-pay | Admitting: *Deleted

## 2021-02-11 DIAGNOSIS — M25551 Pain in right hip: Secondary | ICD-10-CM

## 2021-02-11 NOTE — Telephone Encounter (Signed)
The first order I sent was wrong, I have since resent correct order 3 times. I sent another order for a standard hip xray of right hip. If they continue to have problems please advise them to send me correct order and I will sign in Epic.

## 2021-02-11 NOTE — Telephone Encounter (Signed)
[facility] called regarding an order that was placed for a DG Arthro Hip, right.   Stated that if your wanting a Arthro you will need to also place an order for MRI.

## 2021-02-13 DIAGNOSIS — Z85828 Personal history of other malignant neoplasm of skin: Secondary | ICD-10-CM | POA: Insufficient documentation

## 2021-02-13 DIAGNOSIS — K7460 Unspecified cirrhosis of liver: Secondary | ICD-10-CM | POA: Insufficient documentation

## 2021-02-13 DIAGNOSIS — I10 Essential (primary) hypertension: Secondary | ICD-10-CM | POA: Insufficient documentation

## 2021-02-13 DIAGNOSIS — M25551 Pain in right hip: Secondary | ICD-10-CM | POA: Insufficient documentation

## 2021-02-13 DIAGNOSIS — H903 Sensorineural hearing loss, bilateral: Secondary | ICD-10-CM | POA: Insufficient documentation

## 2021-02-13 DIAGNOSIS — I700 Atherosclerosis of aorta: Secondary | ICD-10-CM | POA: Insufficient documentation

## 2021-02-13 DIAGNOSIS — D696 Thrombocytopenia, unspecified: Secondary | ICD-10-CM | POA: Insufficient documentation

## 2021-02-14 ENCOUNTER — Other Ambulatory Visit: Payer: Self-pay

## 2021-02-14 MED ORDER — DICLOFENAC SODIUM 1 % EX GEL: 2.0000 g | Freq: Every day | CUTANEOUS | Status: AC | PRN

## 2021-02-23 ENCOUNTER — Other Ambulatory Visit: Payer: Self-pay | Admitting: Orthopedic Surgery

## 2021-02-23 DIAGNOSIS — Z780 Asymptomatic menopausal state: Secondary | ICD-10-CM

## 2021-02-28 ENCOUNTER — Other Ambulatory Visit: Payer: Self-pay | Admitting: Orthopedic Surgery

## 2021-02-28 DIAGNOSIS — Z780 Asymptomatic menopausal state: Secondary | ICD-10-CM

## 2021-03-03 ENCOUNTER — Ambulatory Visit (INDEPENDENT_AMBULATORY_CARE_PROVIDER_SITE_OTHER): Payer: Medicare Other | Admitting: Orthopedic Surgery

## 2021-03-03 ENCOUNTER — Ambulatory Visit
Admission: RE | Admit: 2021-03-03 | Discharge: 2021-03-03 | Disposition: A | Discharge status: Home / Self Care | Payer: Medicare Other | Source: Ambulatory Visit | Attending: Orthopedic Surgery | Admitting: Orthopedic Surgery

## 2021-03-03 ENCOUNTER — Other Ambulatory Visit: Payer: Self-pay

## 2021-03-03 ENCOUNTER — Encounter: Payer: Self-pay | Admitting: Orthopedic Surgery

## 2021-03-03 ENCOUNTER — Other Ambulatory Visit: Payer: Self-pay | Admitting: Orthopedic Surgery

## 2021-03-03 DIAGNOSIS — D696 Thrombocytopenia, unspecified: Secondary | ICD-10-CM | POA: Diagnosis not present

## 2021-03-03 DIAGNOSIS — K7460 Unspecified cirrhosis of liver: Secondary | ICD-10-CM

## 2021-03-03 DIAGNOSIS — I10 Essential (primary) hypertension: Secondary | ICD-10-CM

## 2021-03-03 DIAGNOSIS — Z780 Asymptomatic menopausal state: Secondary | ICD-10-CM

## 2021-03-03 DIAGNOSIS — M25551 Pain in right hip: Secondary | ICD-10-CM

## 2021-03-03 DIAGNOSIS — F419 Anxiety disorder, unspecified: Secondary | ICD-10-CM

## 2021-03-03 DIAGNOSIS — I700 Atherosclerosis of aorta: Secondary | ICD-10-CM

## 2021-03-03 DIAGNOSIS — M25561 Pain in right knee: Secondary | ICD-10-CM | POA: Diagnosis not present

## 2021-03-03 LAB — CBC WITH DIFFERENTIAL/PLATELET
Absolute Monocytes: 424 cells/uL (ref 200–950)
Basophils Absolute: 49 cells/uL (ref 0–200)
Basophils Relative: 1.4 %
Eosinophils Absolute: 189 cells/uL (ref 15–500)
Eosinophils Relative: 5.4 %
HCT: 35.9 % (ref 35.0–45.0)
Hemoglobin: 11.9 g/dL (ref 11.7–15.5)
Lymphs Abs: 931 cells/uL (ref 850–3900)
MCH: 30.3 pg (ref 27.0–33.0)
MCHC: 33.1 g/dL (ref 32.0–36.0)
MCV: 91.3 fL (ref 80.0–100.0)
MPV: 10.7 fL (ref 7.5–12.5)
Monocytes Relative: 12.1 %
Neutro Abs: 1908 cells/uL (ref 1500–7800)
Neutrophils Relative %: 54.5 %
Platelets: 110 10*3/uL — ABNORMAL LOW (ref 140–400)
RBC: 3.93 10*6/uL (ref 3.80–5.10)
RDW: 11.9 % (ref 11.0–15.0)
Total Lymphocyte: 26.6 %
WBC: 3.5 10*3/uL — ABNORMAL LOW (ref 3.8–10.8)

## 2021-03-03 MED ORDER — SERTRALINE HCL 25 MG PO TABS
25.0000 mg | ORAL_TABLET | Freq: Every day | ORAL | 3 refills | Status: DC
Start: 2021-03-03 — End: 2021-05-19

## 2021-03-03 MED ORDER — TRAMADOL HCL 50 MG PO TABS: 50.0000 mg | ORAL_TABLET | Freq: Two times a day (BID) | ORAL | 0 refills | Status: DC | PRN

## 2021-03-03 NOTE — Progress Notes (Signed)
Careteam: Patient Care Team: Octavia Heir, NP as PCP - General (Adult Health Nurse Practitioner)  Seen by: [name], AGNP-C  PLACE OF SERVICE:  [facility] CLINIC  Advanced Directive information    No Known Allergies  No chief complaint on file.    HPI: Patient is a 80 y.o. female seen today for medical management of chronic conditions.   Lab results discussed today.   Daughter present during encounter.   Hip/Knee pain- xray of right hip/knee 02/07/2021. Results discussed. She does have some arthritic changes to knee and increased tricompartmental narrowing. Reports having knee injections in the past by Dr. Chanetta Marshall. She did not like previous specialist, reports having a large blood vessel rupture when injection given. Continues to use voltaren gel and tramadol for knee pain. Will also put heat on her knee. No recent falls. Ambulates with came. Furniture grabbing when ambulating. Would like referral for orthopedic specialist.   History of elevated AST/ALT and nodules on liver in 2016- family requesting referral to GI. Asymptomatic. Admits to some abdominal pain at times.   Platelet count remains on low side. She does not take aspirin or blood thinners.   MMSE today- 29/30, clock correct, not shapes. Daughter reports some short term memory issues. She will also focus on the past and repeat things form the past more often.   She has been having increased anxiety since move to [place]. No recent panic attacks. Believes anxiety stems from move, change of life, and health issues. She is able to sleep without any issues. Appears anxious today.   DEXA scan schedule to be done today.   Review of Systems:  Review of Systems  Constitutional:  Negative for chills, fever and malaise/fatigue.  HENT: Negative.    Eyes: Negative.   Respiratory:  Negative for cough, shortness of breath and wheezing.   Cardiovascular:  Negative for chest pain and leg swelling.  Gastrointestinal:   Negative for abdominal pain, blood in stool, constipation, diarrhea, heartburn, nausea and vomiting.  Genitourinary:  Negative for dysuria, frequency and hematuria.  Musculoskeletal:  Negative for falls.       Right hip and knee pain  Skin: Negative.   Neurological:  Negative for dizziness, tingling, seizures, weakness and headaches.  Psychiatric/Behavioral:  Negative for depression and memory loss. The patient is nervous/anxious. The patient does not have insomnia.    Past Medical History:  Diagnosis Date   Arthritis    Cirrhosis (HCC)    Hepatitis B    Hypertension    Past Surgical History:  Procedure Laterality Date   CHOLECYSTECTOMY     TOTAL HIP ARTHROPLASTY Right    Social History:   reports that she quit smoking about 22 years ago. Her smoking use included cigarettes. She has a 20.00 pack-year smoking history. She has never used smokeless tobacco. She reports that she does not drink alcohol and does not use drugs.  Family History  Problem Relation Age of Onset   Stroke Mother    Cancer Father    Healthy Daughter    Healthy Daughter    Healthy Son     Medications: Patient's Medications  New Prescriptions   No medications on file  Previous Medications   ATENOLOL (TENORMIN) 50 MG TABLET    Take by mouth.   CHOLECALCIFEROL (VITAMIN D3 PO)    Take 10 mg by mouth.   LIDOCAINE (LIDODERM) 5 %    Place 1 patch onto the skin daily. Remove & Discard patch within 12  hours or as directed by MD   TRAMADOL (ULTRAM) 50 MG TABLET    Take 50 mg by mouth 2 (two) times daily as needed.   VALSARTAN (DIOVAN) 160 MG TABLET    Take 160 mg by mouth daily.  Modified Medications   No medications on file  Discontinued Medications   No medications on file    Physical Exam:  There were no vitals filed for this visit. There is no height or weight on file to calculate BMI. Wt Readings from Last 3 Encounters:  02/07/21 166 lb 6.4 oz (75.5 kg)  09/23/20 165 lb (74.8 kg)  04/23/20 177 lb  (80.3 kg)    Physical Exam Vitals reviewed.  Constitutional:      General: She is not in acute distress. HENT:     Head: Normocephalic.     Right Ear: There is no impacted cerumen.     Left Ear: There is no impacted cerumen.     Nose: Nose normal.     Mouth/Throat:     Mouth: Mucous membranes are moist.  Eyes:     General:        Right eye: No discharge.        Left eye: No discharge.  Neck:     Vascular: No carotid bruit.  Cardiovascular:     Rate and Rhythm: Normal rate and regular rhythm.     Pulses: Normal pulses.     Heart sounds: Normal heart sounds. No murmur heard. Pulmonary:     Effort: Pulmonary effort is normal. No respiratory distress.     Breath sounds: Normal breath sounds. No wheezing.  Abdominal:     General: Bowel sounds are normal. There is no distension.     Palpations: Abdomen is soft.     Tenderness: There is no abdominal tenderness.  Musculoskeletal:     Cervical back: Normal range of motion.     Right knee: Swelling and effusion present. No crepitus. Normal range of motion. Tenderness present over the MCL.     Right lower leg: No edema.     Left lower leg: No edema.  Lymphadenopathy:     Cervical: No cervical adenopathy.  Skin:    General: Skin is warm and dry.     Capillary Refill: Capillary refill takes less than 2 seconds.  Neurological:     General: No focal deficit present.     Mental Status: She is alert and oriented to person, place, and time.     Motor: Weakness present.     Gait: Gait abnormal.     Comments: Cane  Psychiatric:        Mood and Affect: Mood is anxious.        Behavior: Behavior normal.    Labs reviewed: Basic Metabolic Panel: Recent Labs    09/23/20 1641 02/07/21 1445  NA 136 138  K 3.9 4.7  CL 101 103  CO2 28 29  GLUCOSE 112* 85  BUN 11 17  CREATININE 0.80 0.76  CALCIUM 10.4* 10.7*   Liver Function Tests: Recent Labs    09/23/20 1641 02/07/21 1445  AST 29 26  ALT 15 13  ALKPHOS 93  --   BILITOT  1.0 0.7  PROT 8.6* 7.9  ALBUMIN 3.8  --    No results for input(s): LIPASE, AMYLASE in the last 8760 hours. No results for input(s): AMMONIA in the last 8760 hours. CBC: Recent Labs    09/23/20 1641 02/07/21 1445  WBC 6.8 3.7*  NEUTROABS 4.5 2,017  HGB 12.4 12.1  HCT 38.2 37.6  MCV 93.4 93.1  PLT 115* 108*   Lipid Panel: No results for input(s): CHOL, HDL, LDLCALC, TRIG, CHOLHDL, LDLDIRECT in the last 8760 hours. TSH: No results for input(s): TSH in the last 8760 hours. A1C: No results found for: HGBA1C   Assessment/Plan 1. Hypercalcemia - calcium elevated 10.7 02/07/2021 - PTH  2. Thrombocytopenia (HCC) - platelet count 108 02/07/2021 - CBC with Differential/Platelets  3. Primary hypertension - controlled - cont atenolol and diovan  4. Acute pain of right knee - xray right knee indicated some increases arthritic changes - Ambulatory referral to Orthopedic Surgery - traMADol (ULTRAM) 50 MG tablet; Take 1 tablet (50 mg total) by mouth 2 (two) times daily as needed.  Dispense: 60 tablet; Refill: 0  5. Anxiety - admits to increase anxiety since more to [place] - no recent panic attacks - sertraline (ZOLOFT) 25 MG tablet; Take 1 tablet (25 mg total) by mouth daily.  Dispense: 30 tablet; Refill: 3  6. Right hip pain - stable - recent xray with no abnormalities to previous replacement  7. Cirrhosis of liver without ascites, unspecified hepatic cirrhosis type (HCC) - never f/u in 2016, admits to some intermittent abdominal pain - Ambulatory referral to Gastroenterology  8. Aortic atherosclerosis (HCC) - confirmed CXR 09/2020 - off aspirin due to thrombocytopenia  Total time: 32 minutes. Greater than 50% of total time spent doing patient education regarding knee pain, anxiety and past liver issues.    Next appt: 06/02/2021  [name], Juel Burrow  [facility] [phone]

## 2021-03-03 NOTE — Patient Instructions (Signed)
GI referral and orthopedic referral made

## 2021-03-04 ENCOUNTER — Telehealth: Payer: Self-pay | Admitting: Orthopedic Surgery

## 2021-03-04 LAB — PTH, INTACT AND CALCIUM
Calcium: 10.3 mg/dL (ref 8.6–10.4)
PTH: 66 pg/mL (ref 16–77)

## 2021-03-04 NOTE — Telephone Encounter (Signed)
Discussed lab results with daughter, Saryiah Bencosme. They would like to hold off on GI referral at this time. Will consider referral if she experience abdominal pain.

## 2021-03-11 ENCOUNTER — Telehealth: Payer: Self-pay

## 2021-03-11 ENCOUNTER — Encounter: Payer: Self-pay | Admitting: Orthopaedic Surgery

## 2021-03-11 ENCOUNTER — Ambulatory Visit (INDEPENDENT_AMBULATORY_CARE_PROVIDER_SITE_OTHER): Payer: Medicare Other | Admitting: Orthopaedic Surgery

## 2021-03-11 ENCOUNTER — Other Ambulatory Visit: Payer: Self-pay

## 2021-03-11 DIAGNOSIS — M1711 Unilateral primary osteoarthritis, right knee: Secondary | ICD-10-CM

## 2021-03-11 DIAGNOSIS — M25561 Pain in right knee: Secondary | ICD-10-CM

## 2021-03-11 MED ORDER — METHYLPREDNISOLONE ACETATE 40 MG/ML IJ SUSP
40.0000 mg | INTRAMUSCULAR | Status: AC | PRN
Start: 2021-03-11 — End: 2021-03-11
  Administered 2021-03-11: 40 mg via INTRA_ARTICULAR

## 2021-03-11 MED ORDER — LIDOCAINE HCL 1 % IJ SOLN
2.0000 mL | INTRAMUSCULAR | Status: AC | PRN
  Administered 2021-03-11: 2 mL

## 2021-03-11 MED ORDER — BUPIVACAINE HCL 0.25 % IJ SOLN
2.0000 mL | INTRAMUSCULAR | Status: AC | PRN
  Administered 2021-03-11: 2 mL via INTRA_ARTICULAR

## 2021-03-11 NOTE — Telephone Encounter (Signed)
Noted  

## 2021-03-11 NOTE — Telephone Encounter (Signed)
Right knee visco approval. Melissa Fletcher's patient. Thanks

## 2021-03-11 NOTE — Progress Notes (Signed)
Office Visit Note   Patient: Melissa Fletcher           Date of Birth: 22-Sep-1940           MRN: [id number] Visit Date: 03/11/2021              Requested by: Octavia Heir, NP [phone] [address] [place],  [place] [address] PCP: Octavia Heir, NP   Assessment & Plan: Visit Diagnoses:  1. Unilateral primary osteoarthritis, right knee     Plan: Impression is right knee advanced degenerative joint disease.  Today, we discussed nonoperative versus operative treatment to include repeat cortisone injection, viscosupplementation injection and total knee arthroplasty.  She is not interested in surgery at any point in time.  She would like to get approval for viscosupplementation injection but would like to try 1 more cortisone injection in the meantime.  She will follow-up with [place] once approved for right knee viscosupplementation injection.  This patient is diagnosed with osteoarthritis of the knee(s).    Radiographs show evidence of joint space narrowing, osteophytes, subchondral sclerosis and/or subchondral cysts.  This patient has knee pain which interferes with functional and activities of daily living.    This patient has experienced inadequate response, adverse effects and/or intolerance with conservative treatments such as acetaminophen, NSAIDS, topical creams, physical therapy or regular exercise, knee bracing and/or weight loss.   This patient has experienced inadequate response or has a contraindication to intra articular steroid injections for at least 3 months.   This patient is not scheduled to have a total knee replacement within 6 months of starting treatment with viscosupplementation.   Follow-Up Instructions: Return for once approved for right knee gel inj.   Orders:  Orders Placed This Encounter  Procedures   Large Joint Inj: R knee    No orders of the defined types were placed in this encounter.     Procedures: Large Joint Inj: R knee on 03/11/2021 8:54  AM Indications: pain Details: 22 G needle, anterolateral approach Medications: 2 mL lidocaine 1 %; 2 mL bupivacaine 0.25 %; 40 mg methylPREDNISolone acetate 40 MG/ML     Clinical Data: No additional findings.   Subjective: Chief Complaint  Patient presents with   Right Knee - Pain    HPI patient is a pleasant 80 year old female who comes in today with her daughter.  She is here with right knee pain for the past several years which has progressively worsened.  Pain is to the entire knee.  She does note giving way sensations at times.  Pain is worse with walking.  She has been taking tramadol using Voltaren gel without significant relief.  She did undergo right knee cortisone injection about a year and a half ago which only helped for a few weeks.  No previous viscosupplementation injection.  Review of Systems as detailed in HPI.  All others reviewed and are negative.   Objective: Vital Signs: There were no vitals taken for this visit.  Physical Exam well-developed well-nourished female in no acute distress.  Alert and oriented x3.  Ortho Exam right knee exam shows a trace effusion.  Range of motion 0 to 115 degrees.  Medial joint line tenderness.  Moderate patellofemoral crepitus.  Ligaments are stable.  She is neurovascular intact distally.  Specialty Comments:  No specialty comments available.  Imaging: X-rays reviewed by me in canopy show marked tricompartmental degenerative changes   PMFS History: Patient Active Problem List   Diagnosis Date Noted   Right  hip pain 02/13/2021   Primary hypertension 02/13/2021   Sensorineural hearing loss (SNHL) of both ears 02/13/2021   Cirrhosis of liver without ascites (HCC) 02/13/2021   History of skin cancer 02/13/2021   Aortic atherosclerosis (HCC) 02/13/2021   Hypercalcemia 02/13/2021   Thrombocytopenia (HCC) 02/13/2021   Past Medical History:  Diagnosis Date   Arthritis    Cirrhosis (HCC)    Hepatitis B    Hypertension      Family History  Problem Relation Age of Onset   Stroke Mother    Cancer Father    Healthy Daughter    Healthy Daughter    Healthy Son     Past Surgical History:  Procedure Laterality Date   CHOLECYSTECTOMY     TOTAL HIP ARTHROPLASTY Right    Social History   Occupational History   Not on file  Tobacco Use   Smoking status: Former    Packs/day: 0.50    Years: 40.00    Pack years: 20.00    Types: Cigarettes    Quit date: 2000    Years since quitting: 22.8   Smokeless tobacco: Never  Vaping Use   Vaping Use: Never used  Substance and Sexual Activity   Alcohol use: Never   Drug use: Never   Sexual activity: Not on file

## 2021-03-25 ENCOUNTER — Other Ambulatory Visit: Payer: Self-pay

## 2021-03-25 ENCOUNTER — Ambulatory Visit (INDEPENDENT_AMBULATORY_CARE_PROVIDER_SITE_OTHER): Payer: Medicare Other | Admitting: Family

## 2021-03-25 ENCOUNTER — Encounter: Payer: Self-pay | Admitting: Family

## 2021-03-25 VITALS — BP 138/82 | HR 68 | Temp 96.6°F | Resp 20 | Ht 63.0 in | Wt 162.4 lb

## 2021-03-25 DIAGNOSIS — R058 Other specified cough: Secondary | ICD-10-CM

## 2021-03-25 DIAGNOSIS — H6122 Impacted cerumen, left ear: Secondary | ICD-10-CM

## 2021-03-25 MED ORDER — DEBROX 6.5 % OT SOLN: 5.0000 [drp] | Freq: Two times a day (BID) | OTIC | 0 refills | Status: AC

## 2021-03-25 MED ORDER — DOXYCYCLINE HYCLATE 100 MG PO TABS: 100.0000 mg | ORAL_TABLET | Freq: Two times a day (BID) | ORAL | 0 refills | Status: AC

## 2021-03-25 MED ORDER — ZINC GLUCONATE 50 MG PO TABS: 50.0000 mg | ORAL_TABLET | Freq: Every day | ORAL | 0 refills | Status: AC

## 2021-03-25 NOTE — Progress Notes (Signed)
Provider: Bentley Haralson FNP-C  Octavia Heir, NP  Patient Care Team: Octavia Heir, NP as PCP - General (Adult Health Nurse Practitioner)  Extended Emergency Contact Information Primary Emergency Contact: Shultz,[name] Mobile Phone: [phone] Relation: Daughter Secondary Emergency Contact: Cooper,Sheryl Mobile Phone: [phone] Relation: Daughter  Code Status:  Full Code  Goals of care: Advanced Directive information Advanced Directives 02/07/2021  Does Patient Have a Medical Advance Directive? No  Would patient like information on creating a medical advance directive? -     Chief Complaint  Patient presents with   Acute Visit    Patient presents today for a cough and congestion. She reports coughing up green mucus. She went to the urgent care on Saturday,03/19/21 and COVID, Flu test both was negative. She also had a chest x-ray that showed nothing. She currently is taking Delsym and Zinc 50 MG.    HPI:  [name] seen today for an acute visit for evaluation of cough and congestion x 10 days.Has had runny nose and throat hurts. Cough described as productive green mucus but had to cough up.Mostly non-productive.she was evaluated in Urgent care on Saturday 03/19/21.Her COVID-19 test,Flu test were both negative.Had a CXR which was negative too. Has taken Delsym and zinc 50 mg tablet with no improvement.  No exposure to sick person with COVID- 19 She denies any fever,chills,cough,fatigue,body aches,chest tightness,chest pain,palpitation or shortness of breath.    Past Medical History:  Diagnosis Date   Arthritis    Cirrhosis (HCC)    Hepatitis B    Hypertension    Past Surgical History:  Procedure Laterality Date   CHOLECYSTECTOMY     TOTAL HIP ARTHROPLASTY Right     No Known Allergies  Outpatient Encounter Medications as of 03/25/2021  Medication Sig   atenolol (TENORMIN) 50 MG tablet Take by mouth.   Cholecalciferol (VITAMIN D3 PO) Take 10 mg by  mouth.   lidocaine (LIDODERM) 5 % Place 1 patch onto the skin daily. Remove & Discard patch within 12 hours or as directed by MD   sertraline (ZOLOFT) 25 MG tablet Take 1 tablet (25 mg total) by mouth daily.   traMADol (ULTRAM) 50 MG tablet Take 1 tablet (50 mg total) by mouth 2 (two) times daily as needed.   valsartan (DIOVAN) 160 MG tablet Take 160 mg by mouth daily.   Facility-Administered Encounter Medications as of 03/25/2021  Medication   diclofenac Sodium (VOLTAREN) 1 % topical gel 2 g    Review of Systems  Constitutional:  Negative for appetite change, chills, fatigue and fever.  HENT:  Positive for congestion, rhinorrhea and sore throat. Negative for sinus pressure, sinus pain and sneezing.   Eyes:  Negative for discharge, redness and itching.  Respiratory:  Positive for cough. Negative for chest tightness, shortness of breath and wheezing.   Cardiovascular:  Negative for chest pain, palpitations and leg swelling.  Gastrointestinal:  Negative for abdominal distention, abdominal pain, diarrhea, nausea and vomiting.  Skin:  Negative for color change, pallor and rash.  Neurological:  Negative for dizziness, light-headedness and headaches.    There is no immunization history on file for this patient. Pertinent  Health Maintenance Due  Topic Date Due   INFLUENZA VACCINE  Never done   DEXA SCAN  Completed   Fall Risk 04/23/2020 09/23/2020 02/07/2021 03/03/2021  Falls in the past year? - - 0 0  Was there an injury with Fall? - - 0 0  Fall Risk Category Calculator - -  0 0  Fall Risk Category - - Low Low  Patient Fall Risk Level Low fall risk Low fall risk Low fall risk Low fall risk  Patient at Risk for Falls Due to - - No Fall Risks No Fall Risks  Fall risk Follow up - - Falls evaluation completed Falls evaluation completed;Education provided;Falls prevention discussed   Functional Status Survey:    Vitals:   03/25/21 0932  BP: 138/82  Pulse: 68  Resp: 20  Temp: (!) 96.6 F  (35.9 C)  SpO2: 96%  Weight: 162 lb 6.4 oz (73.7 kg)  Height: 5\' 3"  (1.6 m)   Body mass index is 28.77 kg/m. Physical Exam Vitals reviewed.  Constitutional:      General: She is not in acute distress.    Appearance: Normal appearance. She is normal weight. She is not ill-appearing or diaphoretic.  HENT:     Head: Normocephalic.     Right Ear: Tympanic membrane, ear canal and external ear normal. There is no impacted cerumen.     Left Ear: There is impacted cerumen.     Nose: Rhinorrhea present. No congestion.     Mouth/Throat:     Mouth: Mucous membranes are moist.     Pharynx: Oropharynx is clear. No oropharyngeal exudate or posterior oropharyngeal erythema.  Eyes:     General: No scleral icterus.       Right eye: No discharge.        Left eye: No discharge.     Extraocular Movements: Extraocular movements intact.     Conjunctiva/sclera: Conjunctivae normal.     Pupils: Pupils are equal, round, and reactive to light.  Neck:     Vascular: No carotid bruit.  Cardiovascular:     Rate and Rhythm: Normal rate and regular rhythm.     Pulses: Normal pulses.     Heart sounds: Normal heart sounds. No murmur heard.   No friction rub. No gallop.  Pulmonary:     Effort: Pulmonary effort is normal. No respiratory distress.     Breath sounds: No wheezing, rhonchi or rales.     Comments: Bilateral diminished breath sound  Chest:     Chest wall: No tenderness.  Abdominal:     General: Bowel sounds are normal. There is no distension.     Palpations: Abdomen is soft. There is no mass.     Tenderness: There is no abdominal tenderness. There is no right CVA tenderness, left CVA tenderness, guarding or rebound.  Musculoskeletal:        General: No swelling or tenderness. Normal range of motion.     Cervical back: Normal range of motion. No rigidity or tenderness.     Right lower leg: No edema.     Left lower leg: No edema.     Comments: Unsteady gait ambulates with a cane    Lymphadenopathy:     Cervical: No cervical adenopathy.  Skin:    General: Skin is warm and dry.     Coloration: Skin is not pale.     Findings: No erythema.  Neurological:     Mental Status: She is alert.     Motor: No weakness.     Gait: Gait abnormal.  Psychiatric:        Mood and Affect: Mood normal.        Speech: Speech normal.        Behavior: Behavior normal.        Thought Content: Thought content normal.  Judgment: Judgment normal.    Labs reviewed: Recent Labs    09/23/20 1641 02/07/21 1445 03/03/21 1036  NA 136 138  --   K 3.9 4.7  --   CL 101 103  --   CO2 28 29  --   GLUCOSE 112* 85  --   BUN 11 17  --   CREATININE 0.80 0.76  --   CALCIUM 10.4* 10.7* 10.3   Recent Labs    09/23/20 1641 02/07/21 1445  AST 29 26  ALT 15 13  ALKPHOS 93  --   BILITOT 1.0 0.7  PROT 8.6* 7.9  ALBUMIN 3.8  --    Recent Labs    09/23/20 1641 02/07/21 1445 03/03/21 0911  WBC 6.8 3.7* 3.5*  NEUTROABS 4.5 2,017 1,908  HGB 12.4 12.1 11.9  HCT 38.2 37.6 35.9  MCV 93.4 93.1 91.3  PLT 115* 108* 110*   No results found for: TSH No results found for: HGBA1C No results found for: CHOL, HDL, LDLCALC, LDLDIRECT, TRIG, CHOLHDL  Significant Diagnostic Results in last 30 days:  DG Bone Density  Result Date: 03/03/2021 EXAM: DUAL X-RAY ABSORPTIOMETRY (DXA) FOR BONE MINERAL DENSITY IMPRESSION: Referring Physician:  [name] E FARGO Your patient completed a bone mineral density test using GE Lunar iDXA system (analysis version: 16). Technologist: KAT PATIENT: Name: Ain, Gutkowski Patient ID: [id number] Birth Date: 08-24-1940 Height: 61.5 in. Sex: Female Measured: 03/03/2021 Weight: 162.8 lbs. Indications: Advanced Age, Caucasian, Estrogen Deficient, Height Loss (781.91), Postmenopausal, Right hip replacement, Secondary Osteoporosis Fractures: NONE Treatments: Vitamin D (E933.5) ASSESSMENT: The BMD measured at Femur Neck is 0.741 g/cm2 with a T-score of -2.1. This patient is  considered osteopenic/low bone mass according to World Health Organization [facility]) criteria. The quality of the exam is good. L 4 was excluded due to degenerative changes. Right hip excluded due to surgical hardware. Site Region Measured Date Measured Age YA BMD Significant CHANGE T-score Left Femur Neck   1/03/20[age]         -2.1    0.741 g/cm2 AP Spine   L1-L3  03/03/2019    82.3         -1.9    0.948 g/cm2 Left Femur Total  03/03/2021    82.3         -1.8    0.784 g/cm2 World Health Organization [facility]) criteria for post-menopausal, Caucasian Women: Normal       T-score at or above -1 SD Osteopenia   T-score between -1 and -2.5 SD Osteoporosis T-score at or below -2.5 SD RECOMMENDATION: 1. All patients should optimize calcium and vitamin D intake. 2. Consider FDA-approved medical therapies in postmenopausal women and men aged 65 years and older, based on the following: a. A hip or vertebral (clinical or morphometric) fracture. b. T-score = -2.5 at the femoral neck or spine after appropriate evaluation to exclude secondary causes. c. Low bone mass (T-score between -1.0 and -2.5 at the femoral neck or spine) and a 10-year probability of a hip fracture = 3% or a 10-year probability of a major osteoporosis-related fracture = 20% based on the [place]-adapted WHO algorithm. d. Clinician judgment and/or patient preferences may indicate treatment for people with 10-year fracture probabilities above or below these levels. FOLLOW-UP: Patients with diagnosis of osteoporosis or at high risk for fracture should have regular bone mineral density tests.? Patients eligible for Medicare are allowed routine testing every 2 years.? The testing frequency can be increased to one year for  patients who have rapidly progressing disease, are receiving or discontinuing medical therapy to restore bone mass, or have additional risk factors. I have reviewed this study and agree with the findings. Mark A. Tyron Russell, M.D. [facility]  FRAX* 10-year Probability of Fracture Based on femoral neck BMD: Femur (Left) Major Osteoporotic Fracture: 16.2% Hip Fracture:                5.2% Population:                  [place] (Caucasian) Risk Factors:                Secondary Osteoporosis *FRAX is a Armed forces logistics/support/administrative officer of the [employer] for Metabolic Bone Disease, a World Science writer (WHO) [employer]. ASSESSMENT: The probability of a major osteoporotic fracture is 16.2 % within the next ten years. The probability of a hip fracture is 5.2 % within the next ten years. I have reviewed this report and agree with the above findings. Mark A. Tyron Russell, M.D. [facility] Electronically Signed   By: Ulyses Southward M.D.   On: 03/03/2021 16:34    Assessment/Plan  1. Nonproductive cough Afebrile  Bilateral lung diminished  Start on doxycycline as below  - doxycycline (VIBRA-TABS) 100 MG tablet; Take 1 tablet (100 mg total) by mouth 2 (two) times daily for 10 days.  Dispense: 20 tablet; Refill: 0 - zinc gluconate 50 MG tablet; Take 1 tablet (50 mg total) by mouth daily for 14 days.  Dispense: 14 tablet; Refill: 0 - Notify provider or go to  if symptoms worsen or fail to improve   2. Impacted cerumen of left ear TM not visualized  due to cerumen impaction  - Advised to Instill debrox 6.5 otic solution 5 drops into each ear twice daily x 4 days then follow up for ear lavage.May apply cotton ball at bedtime to prevent drainage to pillow. - carbamide peroxide (DEBROX) 6.5 % OTIC solution; Place 5 drops into the left ear 2 (two) times daily for 4 days.  Dispense: 15 mL; Refill: 0   Family/ staff Communication: Reviewed plan of care with patient and daughter verbalized understanding  Labs/tests ordered: None   Next Appointment: 5 days for left ear lavage.   Melissa Bookman, NP

## 2021-03-25 NOTE — Patient Instructions (Addendum)
-   increase fluid intake/tea/soup  - Notify provider or go to  if symptoms worsen or fail to improve

## 2021-04-01 ENCOUNTER — Other Ambulatory Visit: Payer: Self-pay

## 2021-04-01 ENCOUNTER — Encounter: Payer: Self-pay | Admitting: Orthopedic Surgery

## 2021-04-01 ENCOUNTER — Ambulatory Visit (INDEPENDENT_AMBULATORY_CARE_PROVIDER_SITE_OTHER): Payer: Medicare Other | Admitting: Orthopedic Surgery

## 2021-04-01 VITALS — BP 140/82 | HR 60 | Temp 97.3°F | Resp 16 | Ht 63.0 in | Wt 164.2 lb

## 2021-04-01 DIAGNOSIS — H6121 Impacted cerumen, right ear: Secondary | ICD-10-CM

## 2021-04-01 DIAGNOSIS — R062 Wheezing: Secondary | ICD-10-CM

## 2021-04-01 MED ORDER — ALBUTEROL SULFATE HFA 108 (90 BASE) MCG/ACT IN AERS: 2.0000 | INHALATION_SPRAY | Freq: Four times a day (QID) | RESPIRATORY_TRACT | 0 refills | Status: DC | PRN

## 2021-04-01 NOTE — Progress Notes (Signed)
Careteam: Patient Care Team: Melissa Heir, NP as PCP - General (Adult Health Nurse Practitioner)  Seen by: [name], AGNP-C  PLACE OF SERVICE:  [facility]  Advanced Directive information Does Patient Have a Medical Advance Directive?: No, Would patient like information on creating a medical advance directive?: No - Patient declined  No Known Allergies  Chief Complaint  Patient presents with   Follow-up    Ear Lavage for Left Ear.     HPI: Patient is a 80 y.o. female seen today for acute visit for ear lavage.   She reports using debrox for the past 3 days. She has a history of wax buildup. Discussed using Debrox drops as prevention every month for a few days.  She was seen last week for productive cough. She continues to cough up brown sputum. She is currently taking doxycycline and mucinex for cough. She is having some wheezing, requesting albuterol inhaler. Denies fever, nasal congestion, sore throat, body aches.    Review of Systems:  Review of Systems  Constitutional:  Negative for chills, fever, malaise/fatigue and weight loss.  HENT:  Negative for congestion, ear discharge, ear pain, hearing loss and tinnitus.   Respiratory:  Positive for cough, sputum production and wheezing.   Cardiovascular:  Negative for chest pain and leg swelling.  Psychiatric/Behavioral:  Negative for depression. The patient is not nervous/anxious.    Past Medical History:  Diagnosis Date   Arthritis    Cirrhosis (HCC)    Hepatitis B    Hypertension    Past Surgical History:  Procedure Laterality Date   CHOLECYSTECTOMY     TOTAL HIP ARTHROPLASTY Right    Social History:   reports that she quit smoking about 22 years ago. Her smoking use included cigarettes. She has a 20.00 pack-year smoking history. She has never used smokeless tobacco. She reports that she does not drink alcohol and does not use drugs.  Family History  Problem Relation Age of Onset   Stroke Mother    Cancer  Father    Healthy Daughter    Healthy Daughter    Healthy Son     Medications: Patient's Medications  New Prescriptions   No medications on file  Previous Medications   ATENOLOL (TENORMIN) 50 MG TABLET    Take by mouth.   CHOLECALCIFEROL (VITAMIN D3 PO)    Take 10 mg by mouth.   DOXYCYCLINE (VIBRA-TABS) 100 MG TABLET    Take 1 tablet (100 mg total) by mouth 2 (two) times daily for 10 days.   LIDOCAINE (LIDODERM) 5 %    Place 1 patch onto the skin daily. Remove & Discard patch within 12 hours or as directed by MD   SERTRALINE (ZOLOFT) 25 MG TABLET    Take 1 tablet (25 mg total) by mouth daily.   TRAMADOL (ULTRAM) 50 MG TABLET    Take 1 tablet (50 mg total) by mouth 2 (two) times daily as needed.   VALSARTAN (DIOVAN) 160 MG TABLET    Take 160 mg by mouth daily.   ZINC GLUCONATE 50 MG TABLET    Take 1 tablet (50 mg total) by mouth daily for 14 days.  Modified Medications   No medications on file  Discontinued Medications   No medications on file    Physical Exam:  Vitals:   04/01/21 0948  BP: 140/82  Pulse: 60  Resp: 16  Temp: (!) 97.3 F (36.3 C)  SpO2: 93%  Weight: 164 lb 3.2 oz (74.5  kg)  Height: 5\' 3"  (1.6 m)   Body mass index is 29.09 kg/m. Wt Readings from Last 3 Encounters:  04/01/21 164 lb 3.2 oz (74.5 kg)  03/25/21 162 lb 6.4 oz (73.7 kg)  03/03/21 163 lb 6.4 oz (74.1 kg)    Physical Exam Vitals reviewed.  Constitutional:      General: She is not in acute distress. HENT:     Head: Normocephalic.     Right Ear: There is no impacted cerumen.     Left Ear: There is no impacted cerumen.     Ears:     Comments: Right ear lavaged prior to exam Cardiovascular:     Rate and Rhythm: Normal rate and regular rhythm.  Pulmonary:     Effort: Pulmonary effort is normal.     Breath sounds: Examination of the right-upper field reveals wheezing and rhonchi. Examination of the left-upper field reveals wheezing and rhonchi. Wheezing and rhonchi present.   Musculoskeletal:     Right lower leg: No edema.     Left lower leg: No edema.  Skin:    General: Skin is warm and dry.     Capillary Refill: Capillary refill takes less than 2 seconds.  Neurological:     General: No focal deficit present.     Mental Status: She is alert and oriented to person, place, and time.  Psychiatric:        Mood and Affect: Mood normal.        Behavior: Behavior normal.    Labs reviewed: Basic Metabolic Panel: Recent Labs    09/23/20 1641 02/07/21 1445 03/03/21 1036  NA 136 138  --   K 3.9 4.7  --   CL 101 103  --   CO2 28 29  --   GLUCOSE 112* 85  --   BUN 11 17  --   CREATININE 0.80 0.76  --   CALCIUM 10.4* 10.7* 10.3   Liver Function Tests: Recent Labs    09/23/20 1641 02/07/21 1445  AST 29 26  ALT 15 13  ALKPHOS 93  --   BILITOT 1.0 0.7  PROT 8.6* 7.9  ALBUMIN 3.8  --    No results for input(s): LIPASE, AMYLASE in the last 8760 hours. No results for input(s): AMMONIA in the last 8760 hours. CBC: Recent Labs    09/23/20 1641 02/07/21 1445 03/03/21 0911  WBC 6.8 3.7* 3.5*  NEUTROABS 4.5 2,017 1,908  HGB 12.4 12.1 11.9  HCT 38.2 37.6 35.9  MCV 93.4 93.1 91.3  PLT 115* 108* 110*   Lipid Panel: No results for input(s): CHOL, HDL, LDLCALC, TRIG, CHOLHDL, LDLDIRECT in the last 8760 hours. TSH: No results for input(s): TSH in the last 8760 hours. A1C: No results found for: HGBA1C   Assessment/Plan 1. Wheezing - expiratory wheezing to upper lobes - she was recently treated for URI - albuterol (VENTOLIN HFA) 108 (90 Base) MCG/ACT inhaler; Inhale 2 puffs into the lungs every 6 (six) hours as needed for wheezing or shortness of breath.  Dispense: 8 g; Refill: 0  2. Impacted cerumen of right ear - right ear lavaged today - recommend debrox- 5 drops bid x 5 days every month for maintenance  Total time: 21 minutes. Greater than 50% of total time spent doing patient education regarding cerumen impaction and wheezing.    Next  appt: 06/02/2021  07/31/2021, [name]  [facility] [phone]

## 2021-04-01 NOTE — Patient Instructions (Addendum)
Use debrox drops to soften wax- 5 drops twice daily x 5 days every month  Continue mucinex (NOT DM)- 1 tablet twice daily x 7 days  Use albuterol for wheezing- AS NEEDED

## 2021-04-19 ENCOUNTER — Other Ambulatory Visit: Payer: Self-pay | Admitting: Orthopedic Surgery

## 2021-04-19 DIAGNOSIS — I10 Essential (primary) hypertension: Secondary | ICD-10-CM

## 2021-04-19 NOTE — Telephone Encounter (Signed)
Patient has request refill on medication "Tramadol". Patient last refill was 03/03/2021. Patient doesn't have Non Opioid Contract on file. Patient has upcoming appointment 06/02/2021. Update contract added to patient appointment note. Medication pend and sent to PCP Octavia Heir, NP for approval. Please Advise.

## 2021-04-28 ENCOUNTER — Other Ambulatory Visit: Payer: Self-pay | Admitting: Orthopedic Surgery

## 2021-04-28 DIAGNOSIS — R062 Wheezing: Secondary | ICD-10-CM

## 2021-05-02 ENCOUNTER — Telehealth: Payer: Self-pay

## 2021-05-02 NOTE — Telephone Encounter (Signed)
VOB submitted for SynviscOne, right knee. BV pending.  

## 2021-05-09 ENCOUNTER — Telehealth: Payer: Self-pay

## 2021-05-09 NOTE — Telephone Encounter (Signed)
Approved for SynviscOne, right knee. °Buy & Bill °Medicare deductible has been met °Must meet deductible first for [employer] [place] °Patient will be responsible for 20% OOP for Medicare and 10% OOP for [place] [place] °No Co-pay °Per Willie with [employer] [place], No PA is required for SynviscOne.  °Reference# [id number] ° °Appt. 05/13/2021 °

## 2021-05-13 ENCOUNTER — Encounter: Payer: Self-pay | Admitting: Orthopaedic Surgery

## 2021-05-13 ENCOUNTER — Ambulatory Visit (INDEPENDENT_AMBULATORY_CARE_PROVIDER_SITE_OTHER): Payer: Medicare Other | Admitting: Orthopaedic Surgery

## 2021-05-13 ENCOUNTER — Other Ambulatory Visit: Payer: Self-pay

## 2021-05-13 DIAGNOSIS — M1711 Unilateral primary osteoarthritis, right knee: Secondary | ICD-10-CM | POA: Diagnosis not present

## 2021-05-13 MED ORDER — HYLAN G-F 20 48 MG/6ML IX SOSY
48.0000 mg | PREFILLED_SYRINGE | INTRA_ARTICULAR | Status: AC | PRN
Start: 2021-05-13 — End: 2021-05-13
  Administered 2021-05-13: 48 mg via INTRA_ARTICULAR

## 2021-05-13 NOTE — Progress Notes (Signed)
Office Visit Note   Patient: Melissa Fletcher           Date of Birth: 11-22-1940           MRN: [id number] Visit Date: 05/13/2021              Requested by: Octavia Heir, NP [phone] [address] [place],  [place] [address] PCP: Octavia Heir, NP   Assessment & Plan: Visit Diagnoses:  1. Primary osteoarthritis of right knee     Plan: Ms. Estess is here for Synvisc 1 injection to the right knee today.  She tolerated the injection well.  We will see her back as needed.  Follow-Up Instructions: No follow-ups on file.   Orders:  No orders of the defined types were placed in this encounter.  No orders of the defined types were placed in this encounter.     Procedures: Large Joint Inj: R knee on 05/13/2021 5:03 PM Indications: pain Details: 22 G needle  Arthrogram: No  Medications: 48 mg Hylan 48 MG/6ML Outcome: tolerated well, no immediate complications Patient was prepped and draped in the usual sterile fashion.      Clinical Data: No additional findings.   Subjective: Chief Complaint  Patient presents with   Right Knee - Pain    HPI  Review of Systems   Objective: Vital Signs: There were no vitals taken for this visit.  Physical Exam  Ortho Exam  Specialty Comments:  No specialty comments available.  Imaging: No results found.   PMFS History: Patient Active Problem List   Diagnosis Date Noted   Right hip pain 02/13/2021   Primary hypertension 02/13/2021   Sensorineural hearing loss (SNHL) of both ears 02/13/2021   Cirrhosis of liver without ascites (HCC) 02/13/2021   History of skin cancer 02/13/2021   Aortic atherosclerosis (HCC) 02/13/2021   Hypercalcemia 02/13/2021   Thrombocytopenia (HCC) 02/13/2021   Past Medical History:  Diagnosis Date   Arthritis    Cirrhosis (HCC)    Hepatitis B    Hypertension     Family History  Problem Relation Age of Onset   Stroke Mother    Cancer Father    Healthy Daughter    Healthy Daughter     Healthy Son     Past Surgical History:  Procedure Laterality Date   CHOLECYSTECTOMY     TOTAL HIP ARTHROPLASTY Right    Social History   Occupational History   Not on file  Tobacco Use   Smoking status: Former    Packs/day: 0.50    Years: 40.00    Pack years: 20.00    Types: Cigarettes    Quit date: 2000    Years since quitting: 22.9   Smokeless tobacco: Never  Vaping Use   Vaping Use: Never used  Substance and Sexual Activity   Alcohol use: Never   Drug use: Never   Sexual activity: Not on file

## 2021-05-19 ENCOUNTER — Other Ambulatory Visit: Payer: Self-pay

## 2021-05-19 ENCOUNTER — Ambulatory Visit (INDEPENDENT_AMBULATORY_CARE_PROVIDER_SITE_OTHER): Payer: Medicare Other | Admitting: Orthopedic Surgery

## 2021-05-19 ENCOUNTER — Encounter: Payer: Self-pay | Admitting: Orthopedic Surgery

## 2021-05-19 VITALS — BP 138/82 | HR 81 | Temp 96.2°F | Ht 63.0 in | Wt 161.8 lb

## 2021-05-19 DIAGNOSIS — J3489 Other specified disorders of nose and nasal sinuses: Secondary | ICD-10-CM

## 2021-05-19 DIAGNOSIS — J069 Acute upper respiratory infection, unspecified: Secondary | ICD-10-CM

## 2021-05-19 DIAGNOSIS — R058 Other specified cough: Secondary | ICD-10-CM | POA: Diagnosis not present

## 2021-05-19 MED ORDER — AZITHROMYCIN 250 MG PO TABS: ORAL_TABLET | ORAL | 0 refills | Status: AC

## 2021-05-19 NOTE — Progress Notes (Signed)
Careteam: Patient Care Team: Octavia Heir, NP as PCP - General (Adult Health Nurse Practitioner)  Seen by: [name], AGNP-C  PLACE OF SERVICE:  [facility]  Advanced Directive information    No Known Allergies  Chief Complaint  Patient presents with   Acute Visit    Complains of Sinus Drainage, Chest Congestion with green mucus and Nose Bleeds since 12/23.     HPI: Patient is a 80 y.o. female seen today for acute visit due to sinus congestion, productive cough and nosebleeds.   Daughter present during encounter.   Symptoms began 12/23. Cold began with sinus congestion. Congestion described as clear- blood tinged. Denies active nosebleeds. Productive cough began 12/28, sputum described as thick and green. Also reports intermittent dry throat. She has not tried any medications/remedies to relieve symptoms. She decided to see provider due to worsening symptoms in last 6 days. She has not tested herself for covid. Denies fever, chills, body aches, and fatigue.    Review of Systems:  Review of Systems  Constitutional:  Negative for chills, fever, malaise/fatigue and weight loss.  HENT:  Positive for congestion and sore throat.   Respiratory:  Positive for cough, sputum production and wheezing. Negative for shortness of breath.   Cardiovascular:  Negative for chest pain and leg swelling.  Gastrointestinal: Negative.   Genitourinary: Negative.   Musculoskeletal: Negative.   Neurological: Negative.   Psychiatric/Behavioral:  Positive for memory loss. Negative for depression. The patient is not nervous/anxious.    Past Medical History:  Diagnosis Date   Arthritis    Cirrhosis (HCC)    Hepatitis B    Hypertension    Past Surgical History:  Procedure Laterality Date   CHOLECYSTECTOMY     TOTAL HIP ARTHROPLASTY Right    Social History:   reports that she quit smoking about 23 years ago. Her smoking use included cigarettes. She has a 20.00 pack-year smoking history. She  has never used smokeless tobacco. She reports that she does not drink alcohol and does not use drugs.  Family History  Problem Relation Age of Onset   Stroke Mother    Cancer Father    Healthy Daughter    Healthy Daughter    Healthy Son     Medications: Patient's Medications  New Prescriptions   No medications on file  Previous Medications   ATENOLOL (TENORMIN) 50 MG TABLET    Take by mouth.   CHOLECALCIFEROL (VITAMIN D3 PO)    Take 10 mg by mouth.   LIDOCAINE (LIDODERM) 5 %    Place 1 patch onto the skin daily. Remove & Discard patch within 12 hours or as directed by MD   TRAMADOL (ULTRAM) 50 MG TABLET    TAKE 1 TABLET(50 MG) BY MOUTH TWICE DAILY AS NEEDED   VALSARTAN (DIOVAN) 160 MG TABLET    Take 160 mg by mouth daily.  Modified Medications   No medications on file  Discontinued Medications   ALBUTEROL (VENTOLIN HFA) 108 (90 BASE) MCG/ACT INHALER    INHALE 2 PUFFS INTO THE LUNGS EVERY 6 HOURS AS NEEDED FOR WHEEZING OR SHORTNESS OF BREATH   SERTRALINE (ZOLOFT) 25 MG TABLET    Take 1 tablet (25 mg total) by mouth daily.    Physical Exam:  Vitals:   05/19/21 1244  BP: 138/82  Pulse: 81  Temp: (!) 96.2 F (35.7 C)  TempSrc: Skin  SpO2: 95%  Weight: 161 lb 12.8 oz (73.4 kg)  Height: 5\' 3"  (1.6 m)  Body mass index is 28.66 kg/m. Wt Readings from Last 3 Encounters:  05/19/21 161 lb 12.8 oz (73.4 kg)  04/01/21 164 lb 3.2 oz (74.5 kg)  03/25/21 162 lb 6.4 oz (73.7 kg)    Physical Exam Vitals reviewed.  Constitutional:      General: She is not in acute distress. HENT:     Nose:     Right Turbinates: Enlarged and swollen.     Left Turbinates: Enlarged and swollen.     Right Sinus: No maxillary sinus tenderness or frontal sinus tenderness.     Left Sinus: No maxillary sinus tenderness or frontal sinus tenderness.     Mouth/Throat:     Mouth: Mucous membranes are moist.     Pharynx: No pharyngeal swelling or posterior oropharyngeal erythema.     Tonsils: No  tonsillar exudate or tonsillar abscesses.  Eyes:     General:        Right eye: No discharge.        Left eye: No discharge.  Cardiovascular:     Rate and Rhythm: Normal rate and regular rhythm.     Pulses: Normal pulses.     Heart sounds: Normal heart sounds. No murmur heard. Pulmonary:     Effort: Pulmonary effort is normal. No respiratory distress.     Breath sounds: Examination of the right-upper field reveals rhonchi. Examination of the left-upper field reveals rhonchi. Rhonchi present. No wheezing.  Musculoskeletal:     Cervical back: Normal range of motion.  Lymphadenopathy:     Cervical: No cervical adenopathy.  Skin:    General: Skin is warm and dry.     Capillary Refill: Capillary refill takes less than 2 seconds.  Neurological:     General: No focal deficit present.     Mental Status: She is alert. Mental status is at baseline.     Motor: Weakness present.     Gait: Gait abnormal.     Comments: Cane  Psychiatric:        Mood and Affect: Mood normal.        Behavior: Behavior normal.    Labs reviewed: Basic Metabolic Panel: Recent Labs    09/23/20 1641 02/07/21 1445 03/03/21 1036  NA 136 138  --   K 3.9 4.7  --   CL 101 103  --   CO2 28 29  --   GLUCOSE 112* 85  --   BUN 11 17  --   CREATININE 0.80 0.76  --   CALCIUM 10.4* 10.7* 10.3   Liver Function Tests: Recent Labs    09/23/20 1641 02/07/21 1445  AST 29 26  ALT 15 13  ALKPHOS 93  --   BILITOT 1.0 0.7  PROT 8.6* 7.9  ALBUMIN 3.8  --    No results for input(s): LIPASE, AMYLASE in the last 8760 hours. No results for input(s): AMMONIA in the last 8760 hours. CBC: Recent Labs    09/23/20 1641 02/07/21 1445 03/03/21 0911  WBC 6.8 3.7* 3.5*  NEUTROABS 4.5 2,017 1,908  HGB 12.4 12.1 11.9  HCT 38.2 37.6 35.9  MCV 93.4 93.1 91.3  PLT 115* 108* 110*   Lipid Panel: No results for input(s): CHOL, HDL, LDLCALC, TRIG, CHOLHDL, LDLDIRECT in the last 8760 hours. TSH: No results for input(s):  TSH in the last 8760 hours. A1C: No results found for: HGBA1C   Assessment/Plan 1. Acute upper respiratory infection - swollen nasal turbinates, green sputum, rhonchi lung sounds - suspect URI - azithromycin (  ZITHROMAX) 250 MG tablet; Take 2 tablets on day 1, then 1 tablet daily on days 2 through 5  Dispense: 6 tablet; Refill: 0 - recommend mucinex- 1 tablet twice daily to thin secretions - recommend rest and fluids  2. Productive cough - see above  3. Nose dryness - no active nosebleeds - recommend humidifier qhs  Total time: 21 minutes. Greater than 50% of total time spent doing patient education regarding cold symptoms and medication management.   Next appt: none Everlene Cunning Scherry Ran  [facility] [phone]

## 2021-05-19 NOTE — Patient Instructions (Signed)
Use humidifier every night to help with dry nose  Continue to take mucinex to thin secretions

## 2021-06-02 ENCOUNTER — Other Ambulatory Visit: Payer: Self-pay

## 2021-06-02 ENCOUNTER — Other Ambulatory Visit: Payer: Medicare Other

## 2021-06-02 ENCOUNTER — Encounter: Payer: Self-pay | Admitting: Orthopedic Surgery

## 2021-06-02 ENCOUNTER — Ambulatory Visit (INDEPENDENT_AMBULATORY_CARE_PROVIDER_SITE_OTHER): Payer: Medicare Other | Admitting: Orthopedic Surgery

## 2021-06-02 VITALS — BP 148/92 | HR 77 | Temp 97.7°F | Ht 63.0 in | Wt 162.4 lb

## 2021-06-02 DIAGNOSIS — M25561 Pain in right knee: Secondary | ICD-10-CM

## 2021-06-02 DIAGNOSIS — R058 Other specified cough: Secondary | ICD-10-CM

## 2021-06-02 DIAGNOSIS — D696 Thrombocytopenia, unspecified: Secondary | ICD-10-CM | POA: Diagnosis not present

## 2021-06-02 DIAGNOSIS — G8929 Other chronic pain: Secondary | ICD-10-CM

## 2021-06-02 DIAGNOSIS — I10 Essential (primary) hypertension: Secondary | ICD-10-CM

## 2021-06-02 DIAGNOSIS — M25551 Pain in right hip: Secondary | ICD-10-CM

## 2021-06-02 DIAGNOSIS — K7460 Unspecified cirrhosis of liver: Secondary | ICD-10-CM

## 2021-06-02 DIAGNOSIS — F419 Anxiety disorder, unspecified: Secondary | ICD-10-CM

## 2021-06-02 DIAGNOSIS — R413 Other amnesia: Secondary | ICD-10-CM

## 2021-06-02 MED ORDER — TRAMADOL HCL 50 MG PO TABS: ORAL_TABLET | ORAL | 0 refills | Status: DC

## 2021-06-02 MED ORDER — VALSARTAN 160 MG PO TABS: 160.0000 mg | ORAL_TABLET | Freq: Every day | ORAL | 1 refills | Status: DC

## 2021-06-02 MED ORDER — ATENOLOL 50 MG PO TABS: 50.0000 mg | ORAL_TABLET | Freq: Every day | ORAL | 1 refills | Status: DC

## 2021-06-02 NOTE — Patient Instructions (Addendum)
Please consider getting Shingles and tetanus vaccine.   Eye doctors- Dr. Katy Fitch Dr. Ellie Lunch

## 2021-06-02 NOTE — Progress Notes (Signed)
Careteam: Patient Care Team: Melissa Alanis, NP as PCP - General (Adult Health Nurse Practitioner)  Seen by: Windell Moulding, AGNP-C  PLACE OF SERVICE:  [place] Directive information    No Known Allergies  Chief Complaint  Patient presents with   Medical Management of Chronic Issues    Patient presents today for a 3 month follow-up.   Quality Metric Gaps    Pneumonia, TDAP, zoster,flu     HPI: Patient is a 81 y.o. female seen today for medical management of chronic conditions.   Her cough is gone. She sometimes will cough up colored phlegm, but not often.   Eating 3-4 meals a day. She has cut down on bread. Trying to eat more fruits and vegetables.   She does not have a exercise routine, but walks a lot on campus. No recent falls or injures. Uses cane for long distances.   She saw orthopedics 03/11/2021- gel injection to right knee. Right knee pain had improved. Continues to use voltaren gel and tramadol for right hip pain.   Daughter reports further decline in memory. She is having to repeat stories/facts more. No behavioral outbursts. 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 MMSE 29/30 03/03/2021.   Stopped taking zoloft. Denies recent panic attacks.    Review of Systems:  Review of Systems  Constitutional:  Negative for chills, fever, malaise/fatigue and weight loss.  HENT:  Positive for hearing loss. Negative for sore throat.   Eyes:  Negative for discharge and redness.  Respiratory:  Positive for sputum production. Negative for cough, shortness of breath and wheezing.   Cardiovascular:  Negative for chest pain and leg swelling.  Gastrointestinal:  Negative for abdominal pain, blood in stool, constipation, diarrhea, heartburn, nausea and vomiting.  Genitourinary:  Negative for dysuria, frequency and hematuria.  Musculoskeletal:  Positive for joint pain. Negative for falls.  Neurological:  Negative for dizziness, weakness and  headaches.  Psychiatric/Behavioral:  Positive for memory loss. Negative for depression. The patient is not nervous/anxious and does not have insomnia.   Past Medical History:  Diagnosis Date   Arthritis    Cirrhosis ([place])    Hepatitis B    Hypertension    Past Surgical History:  Procedure Laterality Date   CHOLECYSTECTOMY     TOTAL HIP ARTHROPLASTY Right    Social History:   reports that she quit smoking about 23 years ago. Her smoking use included cigarettes. She has a 20.00 pack-year smoking history. She has never used smokeless tobacco. She reports that she does not drink alcohol and does not use drugs.  Family History  Problem Relation Age of Onset   Stroke Mother    Cancer Father    Healthy Daughter    Healthy Daughter    Healthy Son     Medications: Patient's Medications  New Prescriptions   No medications on file  Previous Medications   ATENOLOL (TENORMIN) 50 MG TABLET    Take by mouth.   CHOLECALCIFEROL (VITAMIN D3 PO)    Take 10 mg by mouth.   LIDOCAINE (LIDODERM) 5 %    Place 1 patch onto the skin daily. Remove & Discard patch within 12 hours or as directed by MD   TRAMADOL (ULTRAM) 50 MG TABLET    TAKE 1 TABLET(50 MG) BY MOUTH TWICE DAILY AS NEEDED   VALSARTAN (DIOVAN) 160 MG TABLET    Take 160 mg by mouth daily.  Modified Medications   No medications on file  Discontinued Medications   No medications on file    Physical Exam:  Vitals:   06/02/21 0824  Weight: 162 lb 6.4 oz (73.7 kg)   Body mass index is 28.77 kg/m. Wt Readings from Last 3 Encounters:  06/02/21 162 lb 6.4 oz (73.7 kg)  05/19/21 161 lb 12.8 oz (73.4 kg)  04/01/21 164 lb 3.2 oz (74.5 kg)    Physical Exam Vitals reviewed.  Constitutional:      General: She is not in acute distress. HENT:     Head: Normocephalic.     Comments: Hearing aids    Ears:     Comments: Bilateral hearing aids Eyes:     General:        Right eye: No discharge.        Left eye: No discharge.  Neck:      Vascular: No carotid bruit.  Cardiovascular:     Rate and Rhythm: Normal rate and regular rhythm.     Pulses: Normal pulses.     Heart sounds: Normal heart sounds. No murmur heard. Pulmonary:     Effort: Pulmonary effort is normal. No respiratory distress.     Breath sounds: Normal breath sounds. No wheezing.  Abdominal:     General: Bowel sounds are normal. There is no distension.     Palpations: Abdomen is soft.     Tenderness: There is no abdominal tenderness.  Musculoskeletal:     Cervical back: Normal range of motion.     Right lower leg: No edema.     Left lower leg: No edema.  Lymphadenopathy:     Cervical: No cervical adenopathy.  Skin:    General: Skin is warm and dry.     Capillary Refill: Capillary refill takes less than 2 seconds.  Neurological:     General: No focal deficit present.     Mental Status: She is alert and oriented to person, place, and time.     Motor: Weakness present.     Gait: Gait abnormal.  Psychiatric:        Mood and Affect: Mood normal.        Behavior: Behavior normal.    Labs reviewed: Basic Metabolic Panel: Recent Labs    09/23/20 1641 02/07/21 1445 03/03/21 1036  NA 136 138  --   K 3.9 4.7  --   CL 101 103  --   CO2 28 29  --   GLUCOSE 112* 85  --   BUN 11 17  --   CREATININE 0.80 0.76  --   CALCIUM 10.4* 10.7* 10.3   Liver Function Tests: Recent Labs    09/23/20 1641 02/07/21 1445  AST 29 26  ALT 15 13  ALKPHOS 93  --   BILITOT 1.0 0.7  PROT 8.6* 7.9  ALBUMIN 3.8  --    No results for input(s): LIPASE, AMYLASE in the last 8760 hours. No results for input(s): AMMONIA in the last 8760 hours. CBC: Recent Labs    09/23/20 1641 02/07/21 1445 03/03/21 0911  WBC 6.8 3.7* 3.5*  NEUTROABS 4.5 2,017 1,908  HGB 12.4 12.1 11.9  HCT 38.2 37.6 35.9  MCV 93.4 93.1 91.3  PLT 115* 108* 110*   Lipid Panel: No results for input(s): CHOL, HDL, LDLCALC, TRIG, CHOLHDL, LDLDIRECT in the last 8760 hours. TSH: No results for  input(s): TSH in the last 8760 hours. A1C: No results found for: HGBA1C   Assessment/Plan 1. Primary hypertension - controlled - cont atenolol and Diovan -  continue to limit sodium in diet  - atenolol (TENORMIN) 50 MG tablet; Take 1 tablet (50 mg total) by mouth daily.  Dispense: 90 tablet; Refill: 1 - valsartan (DIOVAN) 160 MG tablet; Take 1 tablet (160 mg total) by mouth daily.  Dispense: 90 tablet; Refill: 1  2. Chronic pain of right knee - followed by ortho - pain improved with gel injection 02/2021 - traMADol (ULTRAM) 50 MG tablet; TAKE 1 TABLET(50 MG) BY MOUTH TWICE DAILY AS NEEDED  Dispense: 60 tablet; Refill: 0  3. Thrombocytopenia (HCC) - platelets 110 03/03/2021 - CBC with Differential/Platelet  4. Hypercalcemia - PTH 66, calcium 10.3 [id number] - Basic Metabolic Panel  5. Cirrhosis of liver without ascites, unspecified hepatic cirrhosis type ([place]) - AST/ALT 26/13 02/07/2021 - intermittent abdominal pain - Hepatic Function Panel  6. Impaired memory - CT head 09/2020 noted chronic microvascular changes - daughter repeats herself often - MMSE 29/30 03/03/2021 - recommend neurocognitive testing in future is behaviors progress  7. Productive cough - resolved  8. Anxiety - stopped taking Zoloft - no recent panic attacks  9. Right hip pain - stable with Voltaren gel  Total time: 33 minutes. Greater than 50% of total time spent doing patient education on health maintenance, labs and symptom/medication management.    Next appt: Visit date not found  [place] [phone]

## 2021-06-03 ENCOUNTER — Other Ambulatory Visit: Payer: Self-pay | Admitting: Orthopedic Surgery

## 2021-06-03 DIAGNOSIS — D72819 Decreased white blood cell count, unspecified: Secondary | ICD-10-CM

## 2021-06-03 LAB — CBC WITH DIFFERENTIAL/PLATELET
Absolute Monocytes: 297 cells/uL (ref 200–950)
Basophils Absolute: 39 cells/uL (ref 0–200)
Basophils Relative: 1.3 %
Eosinophils Absolute: 138 cells/uL (ref 15–500)
Eosinophils Relative: 4.6 %
HCT: 36.4 % (ref 35.0–45.0)
Hemoglobin: 11.9 g/dL (ref 11.7–15.5)
Lymphs Abs: 714 cells/uL — ABNORMAL LOW (ref 850–3900)
MCH: 30.3 pg (ref 27.0–33.0)
MCHC: 32.7 g/dL (ref 32.0–36.0)
MCV: 92.6 fL (ref 80.0–100.0)
MPV: 11 fL (ref 7.5–12.5)
Monocytes Relative: 9.9 %
Neutro Abs: 1812 cells/uL (ref 1500–7800)
Neutrophils Relative %: 60.4 %
Platelets: 119 10*3/uL — ABNORMAL LOW (ref 140–400)
RBC: 3.93 10*6/uL (ref 3.80–5.10)
RDW: 12.5 % (ref 11.0–15.0)
Total Lymphocyte: 23.8 %
WBC: 3 10*3/uL — ABNORMAL LOW (ref 3.8–10.8)

## 2021-06-03 LAB — BASIC METABOLIC PANEL
BUN: 11 mg/dL (ref 7–25)
CO2: 31 mmol/L (ref 20–32)
Calcium: 10.2 mg/dL (ref 8.6–10.4)
Chloride: 107 mmol/L (ref 98–110)
Creat: 0.72 mg/dL (ref 0.60–0.95)
Glucose, Bld: 95 mg/dL (ref 65–99)
Potassium: 4.2 mmol/L (ref 3.5–5.3)
Sodium: 142 mmol/L (ref 135–146)

## 2021-06-03 LAB — HEPATIC FUNCTION PANEL
AG Ratio: 0.9 (calc) — ABNORMAL LOW (ref 1.0–2.5)
ALT: 12 U/L (ref 6–29)
AST: 27 U/L (ref 10–35)
Albumin: 3.4 g/dL — ABNORMAL LOW (ref 3.6–5.1)
Alkaline phosphatase (APISO): 85 U/L (ref 37–153)
Bilirubin, Direct: 0.1 mg/dL (ref 0.0–0.2)
Globulin: 4 g/dL (calc) — ABNORMAL HIGH (ref 1.9–3.7)
Indirect Bilirubin: 0.5 mg/dL (calc) (ref 0.2–1.2)
Total Bilirubin: 0.6 mg/dL (ref 0.2–1.2)
Total Protein: 7.4 g/dL (ref 6.1–8.1)

## 2021-06-03 NOTE — Progress Notes (Signed)
Lab results discussed with daughter, Kacia Halley. Plan to recheck labs in March 2023.

## 2021-06-08 ENCOUNTER — Emergency Department (HOSPITAL_COMMUNITY)
Admission: EM | Admit: 2021-06-08 | Discharge: 2021-06-08 | Disposition: A | Discharge status: Home / Self Care | Payer: Medicare Other | Attending: Emergency Medicine | Admitting: Emergency Medicine

## 2021-06-08 ENCOUNTER — Emergency Department (HOSPITAL_COMMUNITY): Payer: Medicare Other

## 2021-06-08 ENCOUNTER — Encounter (HOSPITAL_COMMUNITY): Payer: Self-pay | Admitting: Oncology

## 2021-06-08 ENCOUNTER — Other Ambulatory Visit: Payer: Self-pay

## 2021-06-08 DIAGNOSIS — R519 Headache, unspecified: Secondary | ICD-10-CM | POA: Diagnosis present

## 2021-06-08 LAB — I-STAT CHEM 8, ED
BUN: 8 mg/dL (ref 8–23)
Calcium, Ion: 1.4 mmol/L (ref 1.15–1.40)
Chloride: 103 mmol/L (ref 98–111)
Creatinine, Ser: 0.7 mg/dL (ref 0.44–1.00)
Glucose, Bld: 104 mg/dL — ABNORMAL HIGH (ref 70–99)
HCT: 35 % — ABNORMAL LOW (ref 36.0–46.0)
Hemoglobin: 11.9 g/dL — ABNORMAL LOW (ref 12.0–15.0)
Potassium: 4.2 mmol/L (ref 3.5–5.1)
Sodium: 139 mmol/L (ref 135–145)
TCO2: 29 mmol/L (ref 22–32)

## 2021-06-08 LAB — CBC WITH DIFFERENTIAL/PLATELET
Abs Immature Granulocytes: 0.01 10*3/uL (ref 0.00–0.07)
Basophils Absolute: 0 10*3/uL (ref 0.0–0.1)
Basophils Relative: 1 %
Eosinophils Absolute: 0.1 10*3/uL (ref 0.0–0.5)
Eosinophils Relative: 2 %
HCT: 37.8 % (ref 36.0–46.0)
Hemoglobin: 12.5 g/dL (ref 12.0–15.0)
Immature Granulocytes: 0 %
Lymphocytes Relative: 11 %
Lymphs Abs: 0.4 10*3/uL — ABNORMAL LOW (ref 0.7–4.0)
MCH: 31.3 pg (ref 26.0–34.0)
MCHC: 33.1 g/dL (ref 30.0–36.0)
MCV: 94.5 fL (ref 80.0–100.0)
Monocytes Absolute: 0.4 10*3/uL (ref 0.1–1.0)
Monocytes Relative: 12 %
Neutro Abs: 2.5 10*3/uL (ref 1.7–7.7)
Neutrophils Relative %: 74 %
Platelets: 90 10*3/uL — ABNORMAL LOW (ref 150–400)
RBC: 4 MIL/uL (ref 3.87–5.11)
RDW: 13.3 % (ref 11.5–15.5)
WBC: 3.5 10*3/uL — ABNORMAL LOW (ref 4.0–10.5)
nRBC: 0 % (ref 0.0–0.2)

## 2021-06-08 LAB — COMPREHENSIVE METABOLIC PANEL
ALT: 20 U/L (ref 0–44)
AST: 41 U/L (ref 15–41)
Albumin: 3.6 g/dL (ref 3.5–5.0)
Alkaline Phosphatase: 92 U/L (ref 38–126)
Anion gap: 5 (ref 5–15)
BUN: 10 mg/dL (ref 8–23)
CO2: 28 mmol/L (ref 22–32)
Calcium: 9.9 mg/dL (ref 8.9–10.3)
Chloride: 102 mmol/L (ref 98–111)
Creatinine, Ser: 0.68 mg/dL (ref 0.44–1.00)
GFR, Estimated: >60 mL/min (ref >60)
Glucose, Bld: 104 mg/dL — ABNORMAL HIGH (ref 70–99)
Potassium: 4.1 mmol/L (ref 3.5–5.1)
Sodium: 135 mmol/L (ref 135–145)
Total Bilirubin: 0.8 mg/dL (ref 0.3–1.2)
Total Protein: 8.2 g/dL — ABNORMAL HIGH (ref 6.5–8.1)

## 2021-06-08 MED ORDER — ACETAMINOPHEN 325 MG PO TABS: 650.0000 mg | ORAL_TABLET | Freq: Once | ORAL | Status: DC

## 2021-06-08 MED ORDER — SODIUM CHLORIDE (PF) 0.9 % IJ SOLN
INTRAMUSCULAR | Status: AC
  Filled 2021-06-08: qty 50

## 2021-06-08 MED ORDER — IOHEXOL 350 MG/ML SOLN
100.0000 mL | Freq: Once | INTRAVENOUS | Status: AC | PRN
  Administered 2021-06-08: 75 mL via INTRAVENOUS

## 2021-06-08 NOTE — ED Triage Notes (Signed)
Pt bib family from home d/t headache that began Monday.  Family questions if pt has possible brain bleed from fall on Christmas Day.  Pt describes it as, "A lot of pressure in my brain."  Family reports pt calling this morning disoriented and slurring her words.  No obvious stroke sx in triage.

## 2021-06-08 NOTE — ED Provider Notes (Signed)
[facility]-EMERGENCY DEPT Provider Note   CSN: [id number] Arrival date & time: 06/08/21  1026     History  Chief Complaint  Patient presents with   Headache    Melissa Fletcher is a 81 y.o. female.  Patient presents to ER chief complaint of a headache she describes as a pressure-like sensation top of her head.  She states has been there for the past 3 days.  Family also reports she had a fall, however unclear what this happened.  Some family member states he was in [place], other state that it was [place].  Patient otherwise has no other complaints of neck pain or back pain or other extremity pain.  No reports of fevers or cough or vomiting or diarrhea.  She told family numbers that she had a headache and that she had fallen several days ago and there was concern about internal bleeding and patient was brought to the ER.  Family also states that he noticed episodes of slurring her words.  Transient and have since resolved.      Home Medications Prior to Admission medications   Medication Sig Start Date End Date Taking? Authorizing Provider  atenolol (TENORMIN) 50 MG tablet Take 1 tablet (50 mg total) by mouth daily. 06/02/21   Fargo, [name] E, NP  Cholecalciferol (VITAMIN D3 PO) Take 10 mg by mouth.    [provider]  lidocaine (LIDODERM) 5 % Place 1 patch onto the skin daily. Remove & Discard patch within 12 hours or as directed by MD 09/23/20   Arthor CaptainHarris, Abigail, PA-C  traMADol (ULTRAM) 50 MG tablet TAKE 1 TABLET(50 MG) BY MOUTH TWICE DAILY AS NEEDED 06/02/21   Fargo, [name] E, NP  valsartan (DIOVAN) 160 MG tablet Take 1 tablet (160 mg total) by mouth daily. 06/02/21   Octavia HeirFargo, [name] E, NP      Allergies    Patient has no known allergies.    Review of Systems   Review of Systems  Constitutional:  Negative for fever.  HENT:  Negative for ear pain.   Eyes:  Negative for pain.  Respiratory:  Negative for cough.   Cardiovascular:  Negative for  chest pain.  Gastrointestinal:  Negative for abdominal pain.  Genitourinary:  Negative for flank pain.  Musculoskeletal:  Negative for back pain.  Skin:  Negative for rash.  Neurological:  Positive for headaches.   Physical Exam Updated Vital Signs BP (!) 157/76    Pulse 81    Temp 98.8 F (37.1 C) (Oral)    Resp 18    SpO2 93%  Physical Exam Constitutional:      General: She is not in acute distress.    Appearance: Normal appearance.  HENT:     Head: Normocephalic.     Nose: Nose normal.  Eyes:     Extraocular Movements: Extraocular movements intact.  Cardiovascular:     Rate and Rhythm: Normal rate.  Pulmonary:     Effort: Pulmonary effort is normal.  Musculoskeletal:        General: Normal range of motion.     Cervical back: Normal range of motion.  Neurological:     General: No focal deficit present.     Mental Status: She is alert and oriented to person, place, and time. Mental status is at baseline.     Cranial Nerves: No cranial nerve deficit.     Motor: No weakness.     Comments: No neurodeficit noted cranial nerves II  to XII intact.  Strength 5/5 all extremities.    ED Results / Procedures / Treatments   Labs (all labs ordered are listed, but only abnormal results are displayed) Labs Reviewed  CBC WITH DIFFERENTIAL/PLATELET - Abnormal; Notable for the following components:      Result Value   WBC 3.5 (*)    Platelets 90 (*)    Lymphs Abs 0.4 (*)    All other components within normal limits  COMPREHENSIVE METABOLIC PANEL - Abnormal; Notable for the following components:   Glucose, Bld 104 (*)    Total Protein 8.2 (*)    All other components within normal limits  I-STAT CHEM 8, ED - Abnormal; Notable for the following components:   Glucose, Bld 104 (*)    Hemoglobin 11.9 (*)    HCT 35.0 (*)    All other components within normal limits    EKG None  Radiology CT Angio Head W/Cm &/Or Wo Cm  Result Date: 06/08/2021 CLINICAL DATA:  Headache, severe  onset on Monday EXAM: CT ANGIOGRAPHY HEAD AND NECK TECHNIQUE: Multidetector CT imaging of the head and neck was performed using the standard protocol during bolus administration of intravenous contrast. Multiplanar CT image reconstructions and MIPs were obtained to evaluate the vascular anatomy. Carotid stenosis measurements (when applicable) are obtained utilizing NASCET criteria, using the distal internal carotid diameter as the denominator. RADIATION DOSE REDUCTION: This exam was performed according to the departmental dose-optimization program which includes automated exposure control, adjustment of the mA and/or kV according to patient size and/or use of iterative reconstruction technique. CONTRAST:  53mL OMNIPAQUE IOHEXOL 350 MG/ML SOLN COMPARISON:  CTA head and neck 09/23/2020 FINDINGS: CT HEAD FINDINGS Brain: There is no evidence of acute intracranial hemorrhage, extra-axial fluid collection, or acute infarct. Parenchymal volume is normal. The ventricles are normal in size. There is confluent hypodensity throughout the subcortical and periventricular white matter likely reflecting sequela of advanced chronic white matter microangiopathy, not significantly changed since the prior study. There is no mass lesion.  There is no midline shift. Vascular: See below Skull: Normal. Negative for fracture or focal lesion. Sinuses and orbits: The imaged paranasal sinuses are clear. Bilateral lens implants are in place. The globes and orbits are otherwise unremarkable. Other: None. Review of the MIP images confirms the above findings CTA NECK FINDINGS Aortic arch: There is mild calcified atherosclerotic plaque of the aortic arch. A small focus of soft plaque along the anterior margin of the arch is unchanged. The origins of the major branch vessels are patent. The bilateral subclavian arteries are patent. Right carotid system: There is calcified atherosclerotic plaque at the right carotid bulb without hemodynamically  significant stenosis or occlusion. There is no dissection or aneurysm. Left carotid system: There is mild calcified atherosclerotic plaque at the origin of the left common carotid artery. There is mild calcified atherosclerotic plaque at the left carotid bulb and proximal left common carotid artery resulting in less than 50% stenosis. There is no dissection or aneurysm. Vertebral arteries: The vertebral arteries are patent, without hemodynamically significant stenosis or occlusion. There is no dissection or aneurysm. Skeleton: There is mild degenerative change of the cervical spine with grade 1 anterolisthesis of C3 on C4, C4 on C5, and C5 on C6. There is mild degenerative pannus about the dens. There is no acute osseous abnormality or aggressive osseous lesion. There is no visible canal hematoma. Other neck: Soft tissues are unremarkable. Upper chest: The imaged lung apices are clear. Review of the MIP  images confirms the above findings CTA HEAD FINDINGS Anterior circulation: There is mild calcified atherosclerotic plaque in the intracranial ICAs without hemodynamically significant stenosis or occlusion. The bilateral MCAs are patent. The bilateral ACAs are patent. The anterior communicating artery is normal. There is no aneurysm or AVM. Posterior circulation: The bilateral V4 segments are patent. PICA is identified bilaterally. The basilar artery is patent. The bilateral PCAs are patent. The right posterior communicating artery is identified. The left posterior communicating artery is not definitely seen. There is no aneurysm or AVM. Venous sinuses: Patent. Anatomic variants: None. Review of the MIP images confirms the above findings IMPRESSION: 1. No acute intracranial pathology. Unchanged chronic white matter microangiopathy. 2. Mild plaque at the bilateral carotid bulbs, left more than right, without hemodynamically significant stenosis or occlusion. Otherwise, patent vasculature of the head and neck. 3. No  aneurysm identified. Electronically Signed   By: Lesia Hausen M.D.   On: 06/08/2021 13:57   CT Angio Neck W and/or Wo Contrast  Result Date: 06/08/2021 CLINICAL DATA:  Headache, severe onset on Monday EXAM: CT ANGIOGRAPHY HEAD AND NECK TECHNIQUE: Multidetector CT imaging of the head and neck was performed using the standard protocol during bolus administration of intravenous contrast. Multiplanar CT image reconstructions and MIPs were obtained to evaluate the vascular anatomy. Carotid stenosis measurements (when applicable) are obtained utilizing NASCET criteria, using the distal internal carotid diameter as the denominator. RADIATION DOSE REDUCTION: This exam was performed according to the departmental dose-optimization program which includes automated exposure control, adjustment of the mA and/or kV according to patient size and/or use of iterative reconstruction technique. CONTRAST:  79mL OMNIPAQUE IOHEXOL 350 MG/ML SOLN COMPARISON:  CTA head and neck 09/23/2020 FINDINGS: CT HEAD FINDINGS Brain: There is no evidence of acute intracranial hemorrhage, extra-axial fluid collection, or acute infarct. Parenchymal volume is normal. The ventricles are normal in size. There is confluent hypodensity throughout the subcortical and periventricular white matter likely reflecting sequela of advanced chronic white matter microangiopathy, not significantly changed since the prior study. There is no mass lesion.  There is no midline shift. Vascular: See below Skull: Normal. Negative for fracture or focal lesion. Sinuses and orbits: The imaged paranasal sinuses are clear. Bilateral lens implants are in place. The globes and orbits are otherwise unremarkable. Other: None. Review of the MIP images confirms the above findings CTA NECK FINDINGS Aortic arch: There is mild calcified atherosclerotic plaque of the aortic arch. A small focus of soft plaque along the anterior margin of the arch is unchanged. The origins of the major  branch vessels are patent. The bilateral subclavian arteries are patent. Right carotid system: There is calcified atherosclerotic plaque at the right carotid bulb without hemodynamically significant stenosis or occlusion. There is no dissection or aneurysm. Left carotid system: There is mild calcified atherosclerotic plaque at the origin of the left common carotid artery. There is mild calcified atherosclerotic plaque at the left carotid bulb and proximal left common carotid artery resulting in less than 50% stenosis. There is no dissection or aneurysm. Vertebral arteries: The vertebral arteries are patent, without hemodynamically significant stenosis or occlusion. There is no dissection or aneurysm. Skeleton: There is mild degenerative change of the cervical spine with grade 1 anterolisthesis of C3 on C4, C4 on C5, and C5 on C6. There is mild degenerative pannus about the dens. There is no acute osseous abnormality or aggressive osseous lesion. There is no visible canal hematoma. Other neck: Soft tissues are unremarkable. Upper chest: The imaged lung  apices are clear. Review of the MIP images confirms the above findings CTA HEAD FINDINGS Anterior circulation: There is mild calcified atherosclerotic plaque in the intracranial ICAs without hemodynamically significant stenosis or occlusion. The bilateral MCAs are patent. The bilateral ACAs are patent. The anterior communicating artery is normal. There is no aneurysm or AVM. Posterior circulation: The bilateral V4 segments are patent. PICA is identified bilaterally. The basilar artery is patent. The bilateral PCAs are patent. The right posterior communicating artery is identified. The left posterior communicating artery is not definitely seen. There is no aneurysm or AVM. Venous sinuses: Patent. Anatomic variants: None. Review of the MIP images confirms the above findings IMPRESSION: 1. No acute intracranial pathology. Unchanged chronic white matter microangiopathy. 2.  Mild plaque at the bilateral carotid bulbs, left more than right, without hemodynamically significant stenosis or occlusion. Otherwise, patent vasculature of the head and neck. 3. No aneurysm identified. Electronically Signed   By: Lesia HausenPeter  Noone M.D.   On: 06/08/2021 13:57    Procedures Procedures    Medications Ordered in ED Medications  sodium chloride (PF) 0.9 % injection (has no administration in time range)  iohexol (OMNIPAQUE) 350 MG/ML injection 100 mL (75 mLs Intravenous Contrast Given 06/08/21 1327)    ED Course/ Medical Decision Making/ A&P                           Medical Decision Making  Labs are unremarkable white count chemistry normal.  CT imaging of the brain pursued per radiology no acute findings noted no aneurysms or hemorrhage or bleeding noted.  Patient remains without neuro symptoms still.  States that headache is not so much pain and it is a pressure sensation.  She declined Tylenol here and takes tramadol at home.  Advised outpatient follow-up with her doctor within the week, advising immediate return for worsening symptoms new weakness or numbness or any additional concerns.        Final Clinical Impression(s) / ED Diagnoses Final diagnoses:  Acute nonintractable headache, unspecified headache type    Rx / DC Orders ED Discharge Orders     None         Cheryll CockayneHong, Ellsie Violette S, MD 06/08/21 1537

## 2021-06-08 NOTE — ED Provider Triage Note (Signed)
Emergency Medicine Provider Triage Evaluation Note  Melissa Fletcher , a 81 y.o. female  was evaluated in triage.  Pt complains of diffuse headache, severe in nature.  Patient states that she woke up on Monday and felt like her equilibrium was off, symptoms worse with changes in position.  Denies nausea or vomiting, unilateral weakness or numbness, changes in vision, speech, gait.  Patient's daughter is with her today, states that she called her today saying that she had had a fall earlier and thought she had a concussion.  Patient's daughter felt that her speech might have been slurred and she was confused.  Daughter called family who stated she had a very minor fall on Christmas Day, is not anticoagulated.  Daughter last saw patient Thursday of last week, states everything seemed fine at that time.  Daughter denies changes in gait at this time.  Review of Systems  Positive: Headache Negative: Unilateral weakness or numbness  Physical Exam  BP (!) 135/107 (BP Location: Left Arm)    Pulse 80    Temp 98.8 F (37.1 C) (Oral)    Resp 17    SpO2 93%  Gen:   Awake, no distress   Resp:  Normal effort  MSK:   Moves extremities without difficulty  Other:    Medical Decision Making  Medically screening exam initiated at 11:08 AM.  Appropriate orders placed.  Melissa Fletcher was informed that the remainder of the evaluation will be completed by another provider, this initial triage assessment does not replace that evaluation, and the importance of remaining in the ED until their evaluation is complete.     Melissa Fend, PA-C 06/08/21 1109

## 2021-06-28 ENCOUNTER — Other Ambulatory Visit: Payer: Self-pay | Admitting: Orthopedic Surgery

## 2021-06-28 DIAGNOSIS — F419 Anxiety disorder, unspecified: Secondary | ICD-10-CM

## 2021-07-25 ENCOUNTER — Other Ambulatory Visit: Payer: Self-pay

## 2021-07-25 DIAGNOSIS — M25561 Pain in right knee: Secondary | ICD-10-CM

## 2021-07-25 DIAGNOSIS — G8929 Other chronic pain: Secondary | ICD-10-CM

## 2021-07-25 MED ORDER — TRAMADOL HCL 50 MG PO TABS: ORAL_TABLET | ORAL | 0 refills | Status: DC

## 2021-07-25 NOTE — Telephone Encounter (Signed)
Patient and daughter([name]) called request a quantity increase for Tramadol 50 MG. She is currently receiving a quantity of 60 tablet for a 30 days supply.Also patient need a refill for her Tramadol 50 mg. ?

## 2021-08-09 ENCOUNTER — Other Ambulatory Visit: Payer: Medicare Other

## 2021-08-09 ENCOUNTER — Ambulatory Visit (INDEPENDENT_AMBULATORY_CARE_PROVIDER_SITE_OTHER): Payer: Medicare Other | Admitting: Family

## 2021-08-09 ENCOUNTER — Encounter: Payer: Self-pay | Admitting: Family

## 2021-08-09 ENCOUNTER — Other Ambulatory Visit: Payer: Self-pay

## 2021-08-09 VITALS — BP 180/100 | HR 71 | Temp 97.3°F | Resp 16 | Ht 63.0 in | Wt 159.4 lb

## 2021-08-09 DIAGNOSIS — D72819 Decreased white blood cell count, unspecified: Secondary | ICD-10-CM

## 2021-08-09 DIAGNOSIS — I10 Essential (primary) hypertension: Secondary | ICD-10-CM | POA: Diagnosis not present

## 2021-08-09 DIAGNOSIS — R238 Other skin changes: Secondary | ICD-10-CM | POA: Diagnosis not present

## 2021-08-09 LAB — CBC WITH DIFFERENTIAL/PLATELET
Absolute Monocytes: 374 cells/uL (ref 200–950)
Basophils Absolute: 39 cells/uL (ref 0–200)
Basophils Relative: 1 %
Eosinophils Absolute: 261 cells/uL (ref 15–500)
Eosinophils Relative: 6.7 %
HCT: 36.9 % (ref 35.0–45.0)
Hemoglobin: 11.8 g/dL (ref 11.7–15.5)
Lymphs Abs: 1178 cells/uL (ref 850–3900)
MCH: 30.3 pg (ref 27.0–33.0)
MCHC: 32 g/dL (ref 32.0–36.0)
MCV: 94.9 fL (ref 80.0–100.0)
MPV: 10 fL (ref 7.5–12.5)
Monocytes Relative: 9.6 %
Neutro Abs: 2048 cells/uL (ref 1500–7800)
Neutrophils Relative %: 52.5 %
Platelets: 95 10*3/uL — ABNORMAL LOW (ref 140–400)
RBC: 3.89 10*6/uL (ref 3.80–5.10)
RDW: 12.9 % (ref 11.0–15.0)
Total Lymphocyte: 30.2 %
WBC: 3.9 10*3/uL (ref 3.8–10.8)

## 2021-08-09 NOTE — Patient Instructions (Signed)
Wash left ear skin irritated area with warm water and dial soap,rinse,pat dry and apply triple antibiotic ointment once daily. ? ?- Notify provider for any redness,swelling or drainage or not not heal.  ?

## 2021-08-09 NOTE — Progress Notes (Signed)
? ?Provider: Marlowe Sax FNP-C ? ?Yvonna Alanis, NP ? ?Patient Care Team: ?Yvonna Alanis, NP as PCP - General (Adult Health Nurse Practitioner) ? ?Extended Emergency Contact Information ?Primary Emergency Contact: Shultz,[name] ?Mobile Phone: [phone] ?Relation: Daughter ?Secondary Emergency Contact: Cooper,Sheryl ?Mobile Phone: [phone] ?Relation: Daughter ? ?Code Status:  Full Code  ?Goals of care: Advanced Directive information ?Advanced Directives 08/09/2021  ?Does Patient Have a Medical Advance Directive? Yes  ?Type of Advance Directive Living will  ?Does patient want to make changes to medical advance directive? No - Patient declined  ?Copy of [place] in Chart? -  ?Would patient like information on creating a medical advance directive? No - Patient declined  ? ? ? ?Chief Complaint  ?Patient presents with  ? Acute Visit  ?  Patient complains of bump on left ear. Patient noticed this 1 week ago.  ? ? ?HPI:  ?Pt is a 81 y.o. female seen today for an acute visit for evaluation of bump on the left ear x1 week. She is here with her daughter. Not sure whether she has scratched bump or it was irritated by her hearing aids.Daughter says said little bit red today.  She denies any drainage or swelling. Also denies any fever or chills. ?Blood pressure is elevated today. She did not take blood pressure medication this morning prior to visit. States she since she was coming for fasting blood work she was not sure whether to take medication.  Daughter says she will take it after visit.  Checks blood pressure periodically at home especially when they are out at the pharmacy. ? ? ?Past Medical History:  ?Diagnosis Date  ? Arthritis   ? 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 Hepatitis B   ? Hypertension   ? ?Past Surgical History:  ?Procedure Laterality Date  ? CHOLECYSTECTOMY    ? TOTAL HIP ARTHROPLASTY Right   ? ? ?No Known Allergies ? ?Outpatient Encounter Medications as of 08/09/2021  ?Medication Sig  ? atenolol  (TENORMIN) 50 MG tablet Take 1 tablet (50 mg total) by mouth daily.  ? Cholecalciferol (VITAMIN D3 PO) Take 10 mg by mouth.  ? lidocaine (LIDODERM) 5 % Place 1 patch onto the skin daily. Remove & Discard patch within 12 hours or as directed by MD  ? traMADol (ULTRAM) 50 MG tablet TAKE 1 TABLET(50 MG) BY MOUTH TWICE DAILY AS NEEDED  ? valsartan (DIOVAN) 160 MG tablet Take 1 tablet (160 mg total) by mouth daily.  ? ?Facility-Administered Encounter Medications as of 08/09/2021  ?Medication  ? diclofenac Sodium (VOLTAREN) 1 % topical gel 2 g  ? ? ?Review of Systems  ?Constitutional:  Negative for appetite change, chills, fatigue, fever and unexpected weight change.  ?HENT:  Positive for hearing loss. Negative for congestion, ear discharge, ear pain, facial swelling, postnasal drip, rhinorrhea, sinus pressure, sinus pain, sneezing and sore throat.   ?     Wears hearing aids  ?Eyes:  Negative for pain, discharge, redness, itching and visual disturbance.  ?Respiratory:  Negative for cough, chest tightness, shortness of breath and wheezing.   ?Cardiovascular:  Negative for chest pain, palpitations and leg swelling.  ?Skin:  Negative for color change, pallor and rash.  ?     left ear skin irritation  ?Neurological:  Negative for dizziness, speech difficulty, weakness, light-headedness, numbness and headaches.  ? ?Immunization History  ?Administered Date(s) Administered  ? Fluad Quad(high Dose 65+) 01/20/2021  ? PFIZER(Purple Top)SARS-COV-2 Vaccination 09/11/2019, 10/06/2019  ? [employer]  Vaccine Bivalent Booster 72yrs & up 04/16/2020, 11/07/2020  ? ?Pertinent  Health Maintenance Due  ?Topic Date Due  ? INFLUENZA VACCINE  Completed  ? DEXA SCAN  Completed  ? ?Fall Risk 03/03/2021 04/01/2021 06/02/2021 06/08/2021 08/09/2021  ?Falls in the past year? 0 0 0 - 0  ?Was there an injury with Fall? 0 0 0 - 0  ?Fall Risk Category Calculator 0 0 0 - 0  ?Fall Risk Category Low Low Low - Low  ?Patient Fall Risk Level Low fall risk Low  fall risk Low fall risk Moderate fall risk Low fall risk  ?Patient at Risk for Falls Due to No Fall Risks No Fall Risks No Fall Risks - No Fall Risks  ?Fall risk Follow up Falls evaluation completed;Education provided;Falls prevention discussed Falls evaluation completed Falls evaluation completed;Education provided;Falls prevention discussed - Falls evaluation completed  ? ?Functional Status Survey: ?  ? ?Vitals:  ? 08/09/21 0840  ?BP: (!) 180/100  ?Pulse: 71  ?Resp: 16  ?Temp: (!) 97.3 ?F (36.3 ?C)  ?SpO2: 93%  ?Weight: 159 lb 6.4 oz (72.3 kg)  ?Height: 5\' 3"  (1.6 m)  ? ?Body mass index is 28.24 kg/m?Marland Kitchen ?Physical Exam ?Vitals reviewed.  ?Constitutional:   ?   General: She is not in acute distress. ?   Appearance: Normal appearance. She is overweight. She is not ill-appearing or diaphoretic.  ?HENT:  ?   Head: Normocephalic.  ?   Right Ear: Tympanic membrane, ear canal and external ear normal. There is no impacted cerumen.  ?   Left Ear: Tympanic membrane, ear canal and external ear normal. There is no impacted cerumen.  ?   Ears:  ? ?   Comments: Right hearing aid in place ?Eyes:  ?   General: No scleral icterus.    ?   Right eye: No discharge.     ?   Left eye: No discharge.  ?   Extraocular Movements: Extraocular movements intact.  ?   Conjunctiva/sclera: Conjunctivae normal.  ?   Pupils: Pupils are equal, round, and reactive to light.  ?Neck:  ?   Vascular: No carotid bruit.  ?Cardiovascular:  ?   Rate and Rhythm: Normal rate and regular rhythm.  ?   Pulses: Normal pulses.  ?   Heart sounds: Normal heart sounds. No murmur heard. ?  No friction rub. No gallop.  ?Pulmonary:  ?   Effort: Pulmonary effort is normal. No respiratory distress.  ?   Breath sounds: Normal breath sounds. No wheezing, rhonchi or rales.  ?Chest:  ?   Chest wall: No tenderness.  ?Abdominal:  ?   General: Bowel sounds are normal. There is no distension.  ?   Palpations: Abdomen is soft. There is no mass.  ?   Tenderness: There is no abdominal  tenderness. There is no right CVA tenderness, left CVA tenderness, guarding or rebound.  ?Musculoskeletal:  ?   Cervical back: Normal range of motion. No rigidity or tenderness.  ?Lymphadenopathy:  ?   Cervical: No cervical adenopathy.  ?Skin: ?   General: Skin is warm and dry.  ?   Coloration: Skin is not pale.  ?   Findings: No bruising, erythema, lesion or rash.  ?Neurological:  ?   Mental Status: She is alert and oriented to person, place, and time.  ?   Motor: No weakness.  ?   Gait: Gait abnormal.  ?Psychiatric:     ?   Mood and Affect: Mood normal.     ?  Speech: Speech normal.     ?   Behavior: Behavior normal.  ? ? ?Labs reviewed: ?Recent Labs  ?  02/07/21 ?1445 03/03/21 ?1036 06/02/21 ?0908 06/08/21 ?1252 06/08/21 ?1301  ?NA 138  --  142 135 139  ?K 4.7  --  4.2 4.1 4.2  ?CL 103  --  107 102 103  ?CO2 29  --  31 28  --   ?GLUCOSE 85  --  95 104* 104*  ?BUN 17  --  11 10 8   ?CREATININE 0.76  --  0.72 0.68 0.70  ?CALCIUM 10.7* 10.3 10.2 9.9  --   ? ?Recent Labs  ?  09/23/20 ?1641 02/07/21 ?1445 06/02/21 ?0908 06/08/21 ?1252  ?AST 29 26 27  41  ?ALT 15 13 12 20   ?ALKPHOS 93  --   --  92  ?BILITOT 1.0 0.7 0.6 0.8  ?PROT 8.6* 7.9 7.4 8.2*  ?ALBUMIN 3.8  --   --  3.6  ? ?Recent Labs  ?  03/03/21 ?0911 06/02/21 ?0908 06/08/21 ?1252 06/08/21 ?1301  ?WBC 3.5* 3.0* 3.5*  --   ?NEUTROABS 1,908 1,812 2.5  --   ?HGB 11.9 11.9 12.5 11.9*  ?HCT 35.9 36.4 37.8 35.0*  ?MCV 91.3 92.6 94.5  --   ?PLT 110* 119* 90*  --   ? ?No results found for: TSH ?No results found for: HGBA1C ?No results found for: CHOL, HDL, LDLCALC, LDLDIRECT, TRIG, CHOLHDL ? ?Significant Diagnostic Results in last 30 days:  ?No results found. ? ?Assessment/Plan ? ?1. Essential hypertension ?Blood pressure elevated today per daughter she did not take her medication since she was, for fasting blood work.  She will contact her blood pressure medication after visit. Symptomatic ?-Advised to check blood pressure at home and notify provider if still above  140/90 ?Continue on atenolol and valsartan ? ?2. Skin irritation ?Afebrile ?Small bump with skin irritation and slight redness and tenderness to touch. No swelling or drainage noted. ?-Advised to cleanse area

## 2021-08-24 ENCOUNTER — Encounter: Payer: Self-pay | Admitting: Orthopedic Surgery

## 2021-08-25 ENCOUNTER — Ambulatory Visit (INDEPENDENT_AMBULATORY_CARE_PROVIDER_SITE_OTHER): Payer: Medicare Other | Admitting: Orthopedic Surgery

## 2021-08-25 VITALS — BP 122/72 | HR 68 | Temp 97.4°F | Ht 63.0 in | Wt 160.0 lb

## 2021-08-25 DIAGNOSIS — H6011 Cellulitis of right external ear: Secondary | ICD-10-CM

## 2021-08-25 MED ORDER — DOXYCYCLINE HYCLATE 100 MG PO TABS: 100.0000 mg | ORAL_TABLET | Freq: Two times a day (BID) | ORAL | 0 refills | Status: AC

## 2021-08-25 NOTE — Progress Notes (Signed)
? ? ?Careteam: ?Patient Care Team: ?Melissa Heir, NP as PCP - General (Adult Health Nurse Practitioner) ? ?Seen by: Melissa Fletcher, AGNP-C ? ?PLACE OF SERVICE:  ?[facility]  ?Advanced Directive information ?Does Patient Have a Medical Advance Directive?: Yes, Type of Advance Directive: Living will, Does patient want to make changes to medical advance directive?: No - Patient declined ? ?No Known Allergies ? ?Chief Complaint  ?Patient presents with  ? Acute Visit  ?  Bump in Left ear that is getting bigger.   ? ? ? ?HPI: Patient is a 81 y.o. female seen today for acute visit due to left ear lesion.  ? ?Melissa Fletcher present for encounter.  ? ?First noticed 3 weeks ago. She has a painful nodule to left outer ear. Tender to touch, rated 2/10. She has been applying antibiotic ointment to area. No drainage from lesion or ear. Seeking medical treatment since lesion has not resolved.  ? ?Review of Systems:  ?Review of Systems  ?Constitutional:  Negative for chills, fever, malaise/fatigue and weight loss.  ?HENT:  Positive for ear pain. Negative for ear discharge and tinnitus.   ?Respiratory:  Negative for cough, shortness of breath and wheezing.   ?Cardiovascular:  Negative for chest pain and leg swelling.  ?Skin:   ?     Skin lesion  ?Psychiatric/Behavioral:  Positive for memory loss. Negative for depression. The patient is not nervous/anxious.   ? ?Past Medical History:  ?Diagnosis Date  ? Arthritis   ? Cirrhosis (HCC)   ? Hepatitis B   ? Hypertension   ? ?Past Surgical History:  ?Procedure Laterality Date  ? CHOLECYSTECTOMY    ? TOTAL HIP ARTHROPLASTY Right   ? ?Social History: ?  reports that she quit smoking about 23 years ago. Her smoking use included cigarettes. She has a 20.00 pack-year smoking history. She has never used smokeless tobacco. She reports that she does not drink alcohol and does not use drugs. ? ?Family History  ?Problem Relation Age of Onset  ? Stroke Mother   ? Cancer Father   ? Healthy Melissa Fletcher   ? Healthy  Melissa Fletcher   ? Healthy Son   ? ? ?Medications: ?Patient's Medications  ?New Prescriptions  ? No medications on file  ?Previous Medications  ? ATENOLOL (TENORMIN) 50 MG TABLET    Take 1 tablet (50 mg total) by mouth daily.  ? CHOLECALCIFEROL (VITAMIN D3 PO)    Take 10 mg by mouth.  ? LIDOCAINE (LIDODERM) 5 %    Place 1 patch onto the skin daily. Remove & Discard patch within 12 hours or as directed by MD  ? TRAMADOL (ULTRAM) 50 MG TABLET    TAKE 1 TABLET(50 MG) BY MOUTH TWICE DAILY AS NEEDED  ? VALSARTAN (DIOVAN) 160 MG TABLET    Take 1 tablet (160 mg total) by mouth daily.  ?Modified Medications  ? No medications on file  ?Discontinued Medications  ? No medications on file  ? ? ?Physical Exam: ? ?Vitals:  ? 08/25/21 0810  ?BP: 122/72  ?Pulse: 68  ?Temp: (!) 97.4 ?F (36.3 ?C)  ?TempSrc: Temporal  ?SpO2: 93%  ?Weight: 160 lb (72.6 kg)  ?Height: 5\' 3"  (1.6 m)  ? ?Body mass index is 28.34 kg/m?. ?Wt Readings from Last 3 Encounters:  ?08/25/21 160 lb (72.6 kg)  ?08/09/21 159 lb 6.4 oz (72.3 kg)  ?06/02/21 162 lb 6.4 oz (73.7 kg)  ? ? ?Physical Exam ?Vitals reviewed.  ?Constitutional:   ?   General:  She is not in acute distress. ?HENT:  ?   Head: Normocephalic.  ?   Right Ear: Hearing normal.  ?   Left Ear: Hearing, tympanic membrane and ear canal normal. Tenderness present. No swelling.  ?   Ears:  ?   Comments: Small pea- sized lesion to left outer ear, closed, tender to touch, no erythema or swelling.  ?Cardiovascular:  ?   Rate and Rhythm: Normal rate and regular rhythm.  ?Pulmonary:  ?   Effort: Pulmonary effort is normal.  ?   Breath sounds: Normal breath sounds.  ?Abdominal:  ?   General: Bowel sounds are normal. There is no distension.  ?   Palpations: Abdomen is soft.  ?   Tenderness: There is no abdominal tenderness.  ?Skin: ?   General: Skin is warm and dry.  ?   Findings: Lesion present.  ?Neurological:  ?   General: No focal deficit present.  ?   Mental Status: She is alert. Mental status is at baseline.  ?    Motor: Weakness present.  ?   Gait: Gait abnormal.  ?   Comments: Cane  ?Psychiatric:     ?   Mood and Affect: Mood normal.     ?   Behavior: Behavior normal.     ?   Cognition and Memory: Memory is impaired.  ? ? ?Labs reviewed: ?Basic Metabolic Panel: ?Recent Labs  ?  02/07/21 ?1445 03/03/21 ?1036 06/02/21 ?0908 06/08/21 ?1252 06/08/21 ?1301  ?NA 138  --  142 135 139  ?K 4.7  --  4.2 4.1 4.2  ?CL 103  --  107 102 103  ?CO2 29  --  31 28  --   ?GLUCOSE 85  --  95 104* 104*  ?BUN 17  --  11 10 8   ?CREATININE 0.76  --  0.72 0.68 0.70  ?CALCIUM 10.7* 10.3 10.2 9.9  --   ? ?Liver Function Tests: ?Recent Labs  ?  09/23/20 ?1641 02/07/21 ?1445 06/02/21 ?0908 06/08/21 ?1252  ?AST 29 26 27  41  ?ALT 15 13 12 20   ?ALKPHOS 93  --   --  92  ?BILITOT 1.0 0.7 0.6 0.8  ?PROT 8.6* 7.9 7.4 8.2*  ?ALBUMIN 3.8  --   --  3.6  ? ?No results for input(s): LIPASE, AMYLASE in the last 8760 hours. ?No results for input(s): AMMONIA in the last 8760 hours. ?CBC: ?Recent Labs  ?  06/02/21 ?0908 06/08/21 ?1252 06/08/21 ?1301 08/09/21 ?0806  ?WBC 3.0* 3.5*  --  3.9  ?NEUTROABS 1,812 2.5  --  2,048  ?HGB 11.9 12.5 11.9* 11.8  ?HCT 36.4 37.8 35.0* 36.9  ?MCV 92.6 94.5  --  94.9  ?PLT 119* 90*  --  95*  ? ?Lipid Panel: ?No results for input(s): CHOL, HDL, LDLCALC, TRIG, CHOLHDL, LDLDIRECT in the last 8760 hours. ?TSH: ?No results for input(s): TSH in the last 8760 hours. ?A1C: ?No results found for: HGBA1C ? ? ?Assessment/Plan ?1. Cellulitis of auricle of right ear ?- pea sized lesion to outer ear, tender to touch, closed, no drainage ?- doxycycline (VIBRA-TABS) 100 MG tablet; Take 1 tablet (100 mg total) by mouth 2 (two) times daily for 7 days.  Dispense: 14 tablet; Refill: 0 ?- cont antibiotic ointment daily prn for dryness ?- wash area with soap and water when shower ? ?Total time: 17 minutes. Greater than 50% of total time spent doing patient education regarding symptom ans medication management for cellulitis ? ?Next appt:  none ?Dosha Broshears,  AGNP ? ?[facility] ?[phone]  ?

## 2021-08-25 NOTE — Patient Instructions (Signed)
Will prescribe doxycycline for ear lesion- please complete ? ?Do not wash ear aggressively- just wash with soap and water when showering ? ?May put antibiotic ointment/ vaseline over area once a day for moisture ?

## 2021-09-07 ENCOUNTER — Encounter: Payer: Self-pay | Admitting: Orthopedic Surgery

## 2021-09-08 ENCOUNTER — Ambulatory Visit (INDEPENDENT_AMBULATORY_CARE_PROVIDER_SITE_OTHER): Payer: Medicare Other | Admitting: Orthopedic Surgery

## 2021-09-08 VITALS — BP 136/86 | HR 64 | Temp 96.8°F | Ht 63.0 in | Wt 161.2 lb

## 2021-09-08 DIAGNOSIS — M25561 Pain in right knee: Secondary | ICD-10-CM | POA: Diagnosis not present

## 2021-09-08 DIAGNOSIS — Z0000 Encounter for general adult medical examination without abnormal findings: Secondary | ICD-10-CM | POA: Diagnosis not present

## 2021-09-08 DIAGNOSIS — G8929 Other chronic pain: Secondary | ICD-10-CM

## 2021-09-08 MED ORDER — TRAMADOL HCL 50 MG PO TABS: ORAL_TABLET | ORAL | 0 refills | Status: DC

## 2021-09-08 NOTE — Patient Instructions (Signed)
?  Ms. Rayborn , ?Thank you for taking time to come for your Medicare Wellness Visit. I appreciate your ongoing commitment to your health goals. Please review the following plan we discussed and let me know if I can assist you in the future.  ? ?These are the goals we discussed: ? Goals   ? ?  Maintain Mobility and Function   ?  Evidence-based guidance:  ?Emphasize the importance of physical activity and aerobic exercise as included in treatment plan; assess barriers to adherence; consider patient's abilities and preferences.  ?Encourage gradual increase in activity or exercise instead of stopping if pain occurs.  ?Reinforce individual therapy exercise prescription, such as strengthening, stabilization and stretching programs.  ?Promote optimal body mechanics to stabilize the spine with lifting and functional activity.  ?Encourage activity and mobility modifications to facilitate optimal function, such as using a log roll for bed mobility or dressing from a seated position.  ?Reinforce individual adaptive equipment recommendations to limit excessive spinal movements, such as a Event organiser.  ?Assess adequacy of sleep; encourage use of sleep hygiene techniques, such as bedtime routine; use of white noise; dark, cool bedroom; avoiding daytime naps, heavy meals or exercise before bedtime.  ?Promote positions and modification to optimize sleep and sexual activity; consider pillows or positioning devices to assist in maintaining neutral spine.  ?Explore options for applying ergonomic principles at work and home, such as frequent position changes, using ergonomically designed equipment and working at optimal height.  ?Promote modifications to increase comfort with driving such as lumbar support, optimizing seat and steering wheel position, using cruise control and taking frequent rest stops to stretch and walk.   ?Notes:  ?  ? ?  ?  ?This is a list of the screening recommended for you and due dates:  ?Health  Maintenance  ?Topic Date Due  ? Tetanus Vaccine  Never done  ? Zoster (Shingles) Vaccine (1 of 2) Never done  ? Flu Shot  12/20/2021  ? Pneumonia Vaccine  Completed  ? DEXA scan (bone density measurement)  Completed  ? COVID-19 Vaccine  Completed  ? HPV Vaccine  Aged Out  ? ?Recommend Shingles vaccine- please go to local pharmacy if you are interested.  ?

## 2021-09-08 NOTE — Progress Notes (Signed)
? ?Subjective:  ? Melissa Fletcher is a 81 y.o. female who presents for Medicare Annual (Subsequent) preventive examination. ? ?Place of Service: [facility] ?Provider: [name], AGNP-C  ? ?Review of Systems    ? ?Cardiac Risk Factors include: advanced age (>22men, >70 women);sedentary lifestyle;hypertension ? ?   ?Objective:  ?  ?Today's Vitals  ? 09/08/21 0811  ?BP: 136/86  ?Pulse: 64  ?Temp: (!) 96.8 ?F (36 ?C)  ?TempSrc: Temporal  ?SpO2: 90%  ?Weight: 161 lb 3.2 oz (73.1 kg)  ?Height: 5\' 3"  (1.6 m)  ? ?Body mass index is 28.56 kg/m?. ? ? ?  09/07/2021  ? 11:41 AM 08/24/2021  ?  1:31 PM 08/09/2021  ?  8:32 AM 06/08/2021  ? 10:57 AM 04/01/2021  ?  9:47 AM 02/07/2021  ?  1:37 PM 09/23/2020  ?  3:06 PM  ?Advanced Directives  ?Does Patient Have a Medical Advance Directive? Yes Yes Yes Yes No No No  ?Type of Advance Directive Living will Living will Living will Healthcare Power of Attorney     ?Does patient want to make changes to medical advance directive? No - Patient declined No - Patient declined No - Patient declined      ?Copy of Healthcare Power of Attorney in Chart?    No - copy requested     ?Would patient like information on creating a medical advance directive?   No - Patient declined  No - Patient declined  Yes (ED - Information included in AVS)  ? ? ?Current Medications (verified) ?Outpatient Encounter Medications as of 09/08/2021  ?Medication Sig  ? atenolol (TENORMIN) 50 MG tablet Take 1 tablet (50 mg total) by mouth daily.  ? Cholecalciferol (VITAMIN D3 PO) Take 10 mg by mouth.  ? lidocaine (LIDODERM) 5 % Place 1 patch onto the skin daily. Remove & Discard patch within 12 hours or as directed by MD  ? traMADol (ULTRAM) 50 MG tablet TAKE 1 TABLET(50 MG) BY MOUTH TWICE DAILY AS NEEDED  ? valsartan (DIOVAN) 160 MG tablet Take 1 tablet (160 mg total) by mouth daily.  ? ?Facility-Administered Encounter Medications as of 09/08/2021  ?Medication  ? diclofenac Sodium (VOLTAREN) 1 % topical gel 2 g   ? ? ?Allergies (verified) ?Patient has no known allergies.  ? ?History: ?Past Medical History:  ?Diagnosis Date  ? Arthritis   ? Cirrhosis (HCC)   ? Hepatitis B   ? Hypertension   ? ?Past Surgical History:  ?Procedure Laterality Date  ? CHOLECYSTECTOMY    ? TOTAL HIP ARTHROPLASTY Right   ? ?Family History  ?Problem Relation Age of Onset  ? Stroke Mother   ? Cancer Father   ? Healthy Daughter   ? Healthy Daughter   ? Healthy Son   ? ?Social History  ? ?Socioeconomic History  ? Marital status: Widowed  ?  Spouse name: Not on file  ? Number of children: Not on file  ? Years of education: Not on file  ? Highest education level: Not on file  ?Occupational History  ? Not on file  ?Tobacco Use  ? Smoking status: Former  ?  Packs/day: 0.50  ?  Years: 40.00  ?  Pack years: 20.00  ?  Types: Cigarettes  ?  Quit date: 2000  ?  Years since quitting: 23.3  ? Smokeless tobacco: Never  ?Vaping Use  ? Vaping Use: Never used  ?Substance and Sexual Activity  ? Alcohol use: Never  ? Drug use: Never  ?  Sexual activity: Not on file  ?Other Topics Concern  ? Not on file  ?Social History Narrative  ? Diet:  ?   ? Caffeine: Coffee and tea  ?   ? Married, if yes what year: Widowed, 1958  ?   ? Do you live in a house, apartment, assisted living, condo, trailer, ect: Apartment  ?   ? Is it one or more stories: Yes- elevator  ?   ? How many persons live in your home? 1  ?   ? Pets: No  ?   ? Highest level or education completed: 10th grade  ?   ? 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   ? Exercise: No                 Type and how often: No  ?   ?   ? Living Will: No  ? DNR: No  ? POA/HPOA: No  ?   ? Functional Status:  ? Do you have difficulty bathing or dressing yourself? No  ? Do you have difficulty preparing food or eating? No  ? Do you have difficulty managing your medications? No  ? Do you have difficulty managing your finances? Tes, daughter  ? Do you have difficulty affording your medications? No  ? ?Social  Determinants of Health  ? ?Financial Resource Strain: Low Risk   ? Difficulty of Paying Living Expenses: Not hard at all  ?Food Insecurity: No Food Insecurity  ? Worried About Programme researcher, broadcasting/film/video in the Last Year: Never true  ? Ran Out of Food in the Last Year: Never true  ?Transportation Needs: No Transportation Needs  ? Lack of Transportation (Medical): No  ? Lack of Transportation (Non-Medical): No  ?Physical Activity: Inactive  ? Days of Exercise per Week: 0 days  ? Minutes of Exercise per Session: 0 min  ?Stress: No Stress Concern Present  ? Feeling of Stress : Only a little  ?Social Connections: Socially Isolated  ? Frequency of Communication with Friends and Family: More than three times a week  ? Frequency of Social Gatherings with Friends and Family: Three times a week  ? Attends Religious Services: Never  ? Active Member of Clubs or Organizations: No  ? Attends Banker Meetings: Never  ? Marital Status: Widowed  ? ? ?Tobacco Counseling ?Counseling given: Not Answered ? ? ?Clinical Intake: ? ?Pre-visit preparation completed: Yes ? ?Pain : No/denies pain ? ?  ? ?BMI - recorded: 28.56 ?Nutritional Status: BMI 25 -29 Overweight ?Nutritional Risks: None ?Diabetes: No ? ?How often do you need to have someone help you when you read instructions, pamphlets, or other written materials from your doctor or pharmacy?: 4 - Often ?What is the last grade level you completed in school?: 10th grade high school ? ?Diabetic?No ? ?Interpreter Needed?: No ? ?  ? ? ?Activities of Daily Living ? ?  09/08/2021  ?  8:35 AM  ?In your present state of health, do you have any difficulty performing the following activities:  ?Hearing? 0  ?Vision? 0  ?Difficulty concentrating or making decisions? 0  ?Walking or climbing stairs? 1  ?Dressing or bathing? 0  ?Doing errands, shopping? 0  ?Preparing Food and eating ? N  ?Using the Toilet? N  ?In the past six months, have you accidently leaked urine? N  ?Do you have problems with  loss of bowel control? Y  ?Managing your Medications? N  ?Managing your  Finances? N  ?Housekeeping or managing your Housekeeping? N  ? ? ?Patient Care Team: ?Octavia HeirFargo, Brittnee Gaetano E, NP as PCP - General (Adult Health Nurse Practitioner) ? ?Indicate any recent Medical Services you may have received from other than [facility] providers in the past year (date may be approximate). ? ?   ?Assessment:  ? This is a routine wellness examination for [place]. ? ?Hearing/Vision screen ?Hearing Screening - Comments:: Patient has been coming in recently to get ears checked. ?Vision Screening - Comments:: Patient stated that she went a year ago.  ? ?Dietary issues and exercise activities discussed: ?Current Exercise Habits: The patient does not participate in regular exercise at present, Exercise limited by: cardiac condition(s);neurologic condition(s) ? ? Goals Addressed   ? ?  ?  ?  ?  ? This Visit's Progress  ?  Maintain Mobility and Function   On track  ?  Evidence-based guidance:  ?Emphasize the importance of physical activity and aerobic exercise as included in treatment plan; assess barriers to adherence; consider patient's abilities and preferences.  ?Encourage gradual increase in activity or exercise instead of stopping if pain occurs.  ?Reinforce individual therapy exercise prescription, such as strengthening, stabilization and stretching programs.  ?Promote optimal body mechanics to stabilize the spine with lifting and functional activity.  ?Encourage activity and mobility modifications to facilitate optimal function, such as using a log roll for bed mobility or dressing from a seated position.  ?Reinforce individual adaptive equipment recommendations to limit excessive spinal movements, such as a Event organiserlong-handled reacher.  ?Assess adequacy of sleep; encourage use of sleep hygiene techniques, such as bedtime routine; use of white noise; dark, cool bedroom; avoiding daytime naps, heavy meals or exercise before bedtime.  ?Promote positions and  modification to optimize sleep and sexual activity; consider pillows or positioning devices to assist in maintaining neutral spine.  ?Explore options for applying ergonomic principles at work and home, such as fr

## 2021-09-18 ENCOUNTER — Encounter: Payer: Self-pay | Admitting: Orthopedic Surgery

## 2021-09-19 ENCOUNTER — Other Ambulatory Visit: Payer: Self-pay | Admitting: Orthopedic Surgery

## 2021-09-19 ENCOUNTER — Encounter: Payer: Self-pay | Admitting: Orthopedic Surgery

## 2021-09-19 DIAGNOSIS — H6011 Cellulitis of right external ear: Secondary | ICD-10-CM

## 2021-09-19 MED ORDER — DOXYCYCLINE HYCLATE 100 MG PO TABS: 100.0000 mg | ORAL_TABLET | Freq: Two times a day (BID) | ORAL | 0 refills | Status: AC

## 2021-09-19 MED ORDER — MUPIROCIN 2 % EX OINT: TOPICAL_OINTMENT | Freq: Two times a day (BID) | CUTANEOUS | Status: DC

## 2021-09-19 NOTE — Telephone Encounter (Signed)
Message forwarded to [place], [name] E, NP   

## 2021-09-20 ENCOUNTER — Other Ambulatory Visit: Payer: Self-pay | Admitting: Orthopedic Surgery

## 2021-09-20 DIAGNOSIS — H6011 Cellulitis of right external ear: Secondary | ICD-10-CM

## 2021-09-20 MED ORDER — MUPIROCIN CALCIUM 2 % EX CREA: 1.0000 "application " | TOPICAL_CREAM | Freq: Two times a day (BID) | CUTANEOUS | 0 refills | Status: DC

## 2021-09-20 NOTE — Telephone Encounter (Signed)
No Ointment listed in patient's current medication list.  ?Forwarded to [name]  ?

## 2021-09-30 ENCOUNTER — Telehealth: Payer: Self-pay | Admitting: Orthopaedic Surgery

## 2021-09-30 NOTE — Telephone Encounter (Signed)
Last gel inj given 04/2021. Okay to submit for another gel inj or does she need OV first? ?

## 2021-09-30 NOTE — Telephone Encounter (Signed)
[name] is calling to see if Mom can have another Gel injection? ? ?[phone] ?

## 2021-09-30 NOTE — Telephone Encounter (Signed)
Noted  

## 2021-09-30 NOTE — Telephone Encounter (Signed)
Ok to submit

## 2021-10-10 NOTE — Telephone Encounter (Signed)
Next available gel injection would need to be after 11/11/2021.

## 2021-10-18 ENCOUNTER — Other Ambulatory Visit: Payer: Self-pay | Admitting: *Deleted

## 2021-10-18 DIAGNOSIS — I10 Essential (primary) hypertension: Secondary | ICD-10-CM

## 2021-10-18 DIAGNOSIS — G8929 Other chronic pain: Secondary | ICD-10-CM

## 2021-10-18 MED ORDER — ATENOLOL 50 MG PO TABS: 50.0000 mg | ORAL_TABLET | Freq: Every day | ORAL | 1 refills | Status: DC

## 2021-10-18 MED ORDER — TRAMADOL HCL 50 MG PO TABS: ORAL_TABLET | ORAL | 0 refills | Status: DC

## 2021-10-18 NOTE — Telephone Encounter (Signed)
Patient called requesting refill.  Epic LR: 09/08/2021 Pended Rx's and sent to [facility] for approval due to [name] out of office.

## 2021-10-18 NOTE — Telephone Encounter (Signed)
Tramadol and Atenolol e-prescribed.

## 2021-10-25 ENCOUNTER — Telehealth: Payer: Self-pay

## 2021-10-25 NOTE — Telephone Encounter (Signed)
VOB submitted for SynviscOne, right knee BV pending. Appt.needs to be scheduled after 11/11/2021.

## 2021-11-10 ENCOUNTER — Encounter: Payer: Self-pay | Admitting: Orthopedic Surgery

## 2021-11-10 ENCOUNTER — Ambulatory Visit (INDEPENDENT_AMBULATORY_CARE_PROVIDER_SITE_OTHER): Payer: Medicare Other | Admitting: Orthopedic Surgery

## 2021-11-10 VITALS — BP 156/84 | HR 64 | Temp 96.7°F | Ht 63.0 in | Wt 164.6 lb

## 2021-11-10 DIAGNOSIS — R7989 Other specified abnormal findings of blood chemistry: Secondary | ICD-10-CM | POA: Diagnosis not present

## 2021-11-10 DIAGNOSIS — M25473 Effusion, unspecified ankle: Secondary | ICD-10-CM

## 2021-11-10 MED ORDER — POTASSIUM CHLORIDE ER 10 MEQ PO TBCR: 10.0000 meq | EXTENDED_RELEASE_TABLET | Freq: Every day | ORAL | 3 refills | Status: DC | PRN

## 2021-11-10 MED ORDER — FUROSEMIDE 20 MG PO TABS: 20.0000 mg | ORAL_TABLET | Freq: Every day | ORAL | 3 refills | Status: DC | PRN

## 2021-11-10 NOTE — Patient Instructions (Addendum)
Cardiology referral made due to ankle edema and elevated BNP  Furosemide- diuretic- take one daily AS NEEDED for ankle swelling- call me if you have to take MORE THAN 3 DAYS IN A ROW- check for edema by pressing on ankle - take medication in AM  Please take potassium chloride WITH FUROSEMIDE on days that you have to take it  Follow low sodium diet  Try compression stockings- 12 hours on/off  Elevate lower legs 2-4 hours during the day

## 2021-11-10 NOTE — Progress Notes (Signed)
Careteam: Patient Care Team: Octavia Heir, NP as PCP - General (Adult Health Nurse Practitioner)  Seen by: [name], AGNP-C  PLACE OF SERVICE:  [facility]  Advanced Directive information    No Known Allergies  Chief Complaint  Patient presents with   Acute Visit    Swollen feet   Medical Management of Chronic Issues    Patient returns to the office for follow and discuss her swollen feet.    Quality Metric Gaps    Verified [facility] patient is due for: TETANUS/TDAP (Every 10 Years)   Zoster Vaccines- Shingrix       HPI: Patient is a 81 y.o. female seen today for acute visit due to ankle edema.  Daughter present during encounter.   05/14 she was seen by urgent care in [place] due to increased bilateral ankle edema and pain.  Labs unremarkable. BNP was elevated at 266. Bilateral doppler performed 05/15, negative for DVT. Symptoms subsided for while until past week. Today, ankle edema is pitting R>L. Reports following low sodium diet. Denies chest pain, sob, weight fluctuations, orthopnea or injury/bite to feet.    Review of Systems:  Review of Systems  Constitutional:  Negative for chills, fever, malaise/fatigue and weight loss.  Respiratory:  Negative for cough, shortness of breath and wheezing.   Cardiovascular:  Positive for leg swelling. Negative for chest pain and orthopnea.  Musculoskeletal:  Negative for falls and joint pain.  Skin:  Negative for rash.  Psychiatric/Behavioral:  Positive for memory loss. Negative for depression. The patient is not nervous/anxious.     Past Medical History:  Diagnosis Date   Arthritis    Cirrhosis (HCC)    Hepatitis B    Hypertension    Past Surgical History:  Procedure Laterality Date   CHOLECYSTECTOMY     TOTAL HIP ARTHROPLASTY Right    Social History:   reports that she quit smoking about 23 years ago. Her smoking use included cigarettes. She has a 20.00 pack-year smoking history. She has never used smokeless  tobacco. She reports that she does not drink alcohol and does not use drugs.  Family History  Problem Relation Age of Onset   Stroke Mother    Cancer Father    Healthy Daughter    Healthy Daughter    Healthy Son     Medications: Patient's Medications  New Prescriptions   No medications on file  Previous Medications   ATENOLOL (TENORMIN) 50 MG TABLET    Take 1 tablet (50 mg total) by mouth daily.   CHOLECALCIFEROL (VITAMIN D3 PO)    Take 10 mg by mouth.   LIDOCAINE (LIDODERM) 5 %    Place 1 patch onto the skin daily. Remove & Discard patch within 12 hours or as directed by MD   MUPIROCIN CREAM (BACTROBAN) 2 %    Apply 1 application. topically 2 (two) times daily.   TRAMADOL (ULTRAM) 50 MG TABLET    TAKE 1 TABLET(50 MG) BY MOUTH TWICE DAILY AS NEEDED   VALSARTAN (DIOVAN) 160 MG TABLET    Take 1 tablet (160 mg total) by mouth daily.  Modified Medications   No medications on file  Discontinued Medications   No medications on file    Physical Exam:  Vitals:   11/10/21 0845  BP: (!) 156/84  Pulse: 64  Temp: (!) 96.7 F (35.9 C)  SpO2: 95%  Weight: 164 lb 9.6 oz (74.7 kg)  Height: 5\' 3"  (1.6 m)   Body mass index  is 29.16 kg/m. Wt Readings from Last 3 Encounters:  11/10/21 164 lb 9.6 oz (74.7 kg)  09/08/21 161 lb 3.2 oz (73.1 kg)  08/25/21 160 lb (72.6 kg)    Physical Exam Vitals reviewed.  Constitutional:      General: She is not in acute distress. HENT:     Head: Normocephalic.  Eyes:     General:        Right eye: No discharge.        Left eye: No discharge.  Cardiovascular:     Rate and Rhythm: Normal rate and regular rhythm.     Pulses: Normal pulses.     Heart sounds: Normal heart sounds. No murmur heard. Pulmonary:     Effort: Pulmonary effort is normal. No respiratory distress.     Breath sounds: Normal breath sounds. No wheezing.  Abdominal:     General: Bowel sounds are normal. There is no distension.     Palpations: Abdomen is soft.      Tenderness: There is no abdominal tenderness.  Musculoskeletal:     Cervical back: Neck supple.     Right lower leg: Edema present.     Left lower leg: Edema present.     Comments: 1+ pitting, R>L  Skin:    General: Skin is warm and dry.     Capillary Refill: Capillary refill takes less than 2 seconds.     Comments: No skin breakdown to BLE  Neurological:     General: No focal deficit present.     Mental Status: She is alert. Mental status is at baseline.     Motor: Weakness present.     Gait: Gait abnormal.  Psychiatric:        Mood and Affect: Mood normal.        Behavior: Behavior normal.     Labs reviewed: Basic Metabolic Panel: Recent Labs    02/07/21 1445 03/03/21 1036 06/02/21 0908 06/08/21 1252 06/08/21 1301  NA 138  --  142 135 139  K 4.7  --  4.2 4.1 4.2  CL 103  --  107 102 103  CO2 29  --  31 28  --   GLUCOSE 85  --  95 104* 104*  BUN 17  --  11 10 8   CREATININE 0.76  --  0.72 0.68 0.70  CALCIUM 10.7* 10.3 10.2 9.9  --    Liver Function Tests: Recent Labs    02/07/21 1445 06/02/21 0908 06/08/21 1252  AST 26 27 41  ALT 13 12 20   ALKPHOS  --   --  92  BILITOT 0.7 0.6 0.8  PROT 7.9 7.4 8.2*  ALBUMIN  --   --  3.6   No results for input(s): "LIPASE", "AMYLASE" in the last 8760 hours. No results for input(s): "AMMONIA" in the last 8760 hours. CBC: Recent Labs    06/02/21 0908 06/08/21 1252 06/08/21 1301 08/09/21 0806  WBC 3.0* 3.5*  --  3.9  NEUTROABS 1,812 2.5  --  2,048  HGB 11.9 12.5 11.9* 11.8  HCT 36.4 37.8 35.0* 36.9  MCV 92.6 94.5  --  94.9  PLT 119* 90*  --  95*   Lipid Panel: No results for input(s): "CHOL", "HDL", "LDLCALC", "TRIG", "CHOLHDL", "LDLDIRECT" in the last 8760 hours. TSH: No results for input(s): "TSH" in the last 8760 hours. A1C: No results found for: "HGBA1C"   Assessment/Plan 1. Ankle edema - onset x 1 month - doppler negative for dvt - 1+ pitting  edema R>L, BNP 226, otherwise asymptomatic - will start  furosemide/KCL daily prn for swelling - furosemide (LASIX) 20 MG tablet; Take 1 tablet (20 mg total) by mouth daily as needed. For ankle edema  Dispense: 30 tablet; Refill: 3 - potassium chloride (KLOR-CON) 10 MEQ tablet; Take 1 tablet (10 mEq total) by mouth daily as needed. Take with furosemide  Dispense: 30 tablet; Refill: 3 - recommend low sodium diet - recommend trying ted hose  - recommend elevating legs 2-4 hours during day  2. Elevated brain natriuretic peptide (BNP) level - see above - Ambulatory referral to Cardiology  Total time: 31 minutes. Greater than 50% of total time spent doing patient education regarding lower leg swelling including symptom/medication management.   Next appt: 12/01/2021  [name], Juel Burrow  [facility] [phone]

## 2021-11-27 NOTE — Progress Notes (Unsigned)
Cardiology Office Note   Date:  11/27/2021   ID:  Melissa, Fletcher October 26, 1940, MRN [id number]  PCP:  Octavia Heir, NP  Cardiologist:   None Referring:  ***  No chief complaint on file.     History of Present Illness: Melissa Fletcher is a 81 y.o. female who is referred by Octavia Heir, NP for evaluation of leg swelling and an elevated BNP.   She had negative venous dopplers.  ***    Past Medical History:  Diagnosis Date   Arthritis    Cirrhosis (HCC)    Hepatitis B    Hypertension     Past Surgical History:  Procedure Laterality Date   CHOLECYSTECTOMY     TOTAL HIP ARTHROPLASTY Right      Current Outpatient Medications  Medication Sig Dispense Refill   atenolol (TENORMIN) 50 MG tablet Take 1 tablet (50 mg total) by mouth daily. 90 tablet 1   Cholecalciferol (VITAMIN D3 PO) Take 10 mg by mouth.     furosemide (LASIX) 20 MG tablet Take 1 tablet (20 mg total) by mouth daily as needed. For ankle edema 30 tablet 3   lidocaine (LIDODERM) 5 % Place 1 patch onto the skin daily. Remove & Discard patch within 12 hours or as directed by MD 30 patch 0   mupirocin cream (BACTROBAN) 2 % Apply 1 application. topically 2 (two) times daily. 15 g 0   potassium chloride (KLOR-CON) 10 MEQ tablet Take 1 tablet (10 mEq total) by mouth daily as needed. Take with furosemide 30 tablet 3   traMADol (ULTRAM) 50 MG tablet TAKE 1 TABLET(50 MG) BY MOUTH TWICE DAILY AS NEEDED 60 tablet 0   valsartan (DIOVAN) 160 MG tablet Take 1 tablet (160 mg total) by mouth daily. 90 tablet 1   Current Facility-Administered Medications  Medication Dose Route Frequency Provider Last Rate Last Admin   diclofenac Sodium (VOLTAREN) 1 % topical gel 2 g  2 g Topical Daily PRN [place], [name] E, NP        Allergies:   Patient has no known allergies.    Social History:  The patient  reports that she quit smoking about 23 years ago. Her smoking use included cigarettes. She has a 20.00 pack-year smoking history. She  has never used smokeless tobacco. She reports that she does not drink alcohol and does not use drugs.   Family History:  The patient's ***family history includes Cancer in her father; Healthy in her daughter, daughter, and son; Stroke in her mother.    ROS:  Please see the history of present illness.   Otherwise, review of systems are positive for {NONE DEFAULTED:18576}.   All other systems are reviewed and negative.    PHYSICAL EXAM: VS:  There were no vitals taken for this visit. , BMI There is no height or weight on file to calculate BMI. GENERAL:  Well appearing HEENT:  Pupils equal round and reactive, fundi not visualized, oral mucosa unremarkable NECK:  No jugular venous distention, waveform within normal limits, carotid upstroke brisk and symmetric, no bruits, no thyromegaly LYMPHATICS:  No cervical, inguinal adenopathy LUNGS:  Clear to auscultation bilaterally BACK:  No CVA tenderness CHEST:  Unremarkable HEART:  PMI not displaced or sustained,S1 and S2 within normal limits, no S3, no S4, no clicks, no rubs, *** murmurs ABD:  Flat, positive bowel sounds normal in frequency in pitch, no bruits, no rebound, no guarding, no midline pulsatile mass, no hepatomegaly, no splenomegaly  EXT:  2 plus pulses throughout, no edema, no cyanosis no clubbing SKIN:  No rashes no nodules NEURO:  Cranial nerves II through XII grossly intact, motor grossly intact throughout PSYCH:  Cognitively intact, oriented to person place and time    EKG:  EKG {ACTION; IS/IS ZOX:09604540} ordered today. The ekg ordered today demonstrates ***   Recent Labs: 06/08/2021: ALT 20; BUN 8; Creatinine, Ser 0.70; Potassium 4.2; Sodium 139 08/09/2021: Hemoglobin 11.8; Platelets 95    Lipid Panel No results found for: "CHOL", "TRIG", "HDL", "CHOLHDL", "VLDL", "LDLCALC", "LDLDIRECT"    Wt Readings from Last 3 Encounters:  11/10/21 164 lb 9.6 oz (74.7 kg)  09/08/21 161 lb 3.2 oz (73.1 kg)  08/25/21 160 lb (72.6 kg)       Other studies Reviewed: Additional studies/ records that were reviewed today include: ***. Review of the above records demonstrates:  Please see elsewhere in the note.  ***   ASSESSMENT AND PLAN:  Edema:  ***   Current medicines are reviewed at length with the patient today.  The patient {ACTIONS; HAS/DOES NOT HAVE:19233} concerns regarding medicines.  The following changes have been made:  {PLAN; NO CHANGE:13088:s}  Labs/ tests ordered today include: *** No orders of the defined types were placed in this encounter.    Disposition:   FU with ***    Signed, Rollene Rotunda, MD  11/27/2021 5:55 PM    [facility]

## 2021-11-28 ENCOUNTER — Other Ambulatory Visit: Payer: Self-pay

## 2021-11-28 DIAGNOSIS — M1711 Unilateral primary osteoarthritis, right knee: Secondary | ICD-10-CM

## 2021-11-29 ENCOUNTER — Other Ambulatory Visit: Payer: Self-pay | Admitting: *Deleted

## 2021-11-29 ENCOUNTER — Encounter: Payer: Self-pay | Admitting: Cardiology

## 2021-11-29 ENCOUNTER — Ambulatory Visit: Payer: Medicare Other | Admitting: Cardiovascular Disease

## 2021-11-29 ENCOUNTER — Ambulatory Visit (INDEPENDENT_AMBULATORY_CARE_PROVIDER_SITE_OTHER): Payer: Medicare Other | Admitting: Cardiology

## 2021-11-29 VITALS — BP 140/74 | HR 68 | Ht 62.0 in | Wt 162.2 lb

## 2021-11-29 DIAGNOSIS — I700 Atherosclerosis of aorta: Secondary | ICD-10-CM | POA: Diagnosis not present

## 2021-11-29 DIAGNOSIS — R609 Edema, unspecified: Secondary | ICD-10-CM | POA: Diagnosis not present

## 2021-11-29 DIAGNOSIS — M7989 Other specified soft tissue disorders: Secondary | ICD-10-CM

## 2021-11-29 DIAGNOSIS — G8929 Other chronic pain: Secondary | ICD-10-CM

## 2021-11-29 MED ORDER — TRAMADOL HCL 50 MG PO TABS: ORAL_TABLET | ORAL | 0 refills | Status: DC

## 2021-11-29 NOTE — Patient Instructions (Signed)
  Testing/Procedures:  Your physician has requested that you have an echocardiogram. Echocardiography is a painless test that uses sound waves to create images of your heart. It provides your doctor with information about the size and shape of your heart and how well your heart's chambers and valves are working. This procedure takes approximately one hour. There are no restrictions for this procedure. [address]   Follow-Up: At [facility], you and your health needs are our priority.  As part of our continuing mission to provide you with exceptional heart care, we have created designated Provider Care Teams.  These Care Teams include your primary Cardiologist (physician) and Advanced Practice Providers (APPs -  Physician Assistants and Nurse Practitioners) who all work together to provide you with the care you need, when you need it.  We recommend signing up for the patient portal called "MyChart".  Sign up information is provided on this After Visit Summary.  MyChart is used to connect with patients for Virtual Visits (Telemedicine).  Patients are able to view lab/test results, encounter notes, upcoming appointments, etc.  Non-urgent messages can be sent to your provider as well.   To learn more about what you can do with MyChart, go to [email or web address].    Your next appointment:    AS NEEDED  Important Information About Sugar       

## 2021-11-29 NOTE — Telephone Encounter (Signed)
Patient requested refill.  Epic LR: 10/18/2021 Contract Date: 06/02/21 Pended Rx and sent to [name] for approval.

## 2021-11-30 ENCOUNTER — Ambulatory Visit (INDEPENDENT_AMBULATORY_CARE_PROVIDER_SITE_OTHER): Payer: Medicare Other | Admitting: Orthopaedic Surgery

## 2021-11-30 ENCOUNTER — Telehealth: Payer: Self-pay | Admitting: Orthopaedic Surgery

## 2021-11-30 ENCOUNTER — Encounter: Payer: Self-pay | Admitting: Orthopaedic Surgery

## 2021-11-30 DIAGNOSIS — M1711 Unilateral primary osteoarthritis, right knee: Secondary | ICD-10-CM | POA: Diagnosis not present

## 2021-11-30 NOTE — Telephone Encounter (Signed)
Noted! Thank you

## 2021-11-30 NOTE — Telephone Encounter (Signed)
Appointment for next gel injection needs to be after 06/02/2022.  If possible can you call her and give her the next available day after that date please.  Thank you

## 2021-11-30 NOTE — Progress Notes (Unsigned)
Office Visit Note   Patient: Melissa Fletcher           Date of Birth: 06/16/1940           MRN: [id number] Visit Date: 11/30/2021              Requested by: Octavia Heir, NP [phone] [address] [place],  [place] [address] PCP: Octavia Heir, NP   Assessment & Plan: Visit Diagnoses:  1. Unilateral primary osteoarthritis, right knee     Plan: Impression is right knee osteoarthritis.  Today, we proceeded with Synvisc 1 injection.  She tolerated this well.  She will follow-up with [place] as needed.  Follow-Up Instructions: Return if symptoms worsen or fail to improve.   Orders:  No orders of the defined types were placed in this encounter.  No orders of the defined types were placed in this encounter.     Procedures: Large Joint Inj: R knee on 11/30/2021 8:25 AM Indications: pain Details: 22 G needle, anterolateral approach Medications: 2 mL lidocaine 1 %; 2 mL bupivacaine 0.25 %; 48 mg Hylan 48 MG/6ML      Clinical Data: No additional findings.   Subjective: No chief complaint on file.   HPI patient is a pleasant 81 year old female who comes in today for Synvisc 1 injection to the right knee.  She last had this injection in December which helped for about 3 to 4 months.  Pain is returned and has gradually worsened.       Objective: Vital Signs: There were no vitals taken for this visit.    Ortho Exam stable right knee exam  Specialty Comments:  No specialty comments available.  Imaging: No new imaging   PMFS History: Patient Active Problem List   Diagnosis Date Noted   Right hip pain 02/13/2021   Primary hypertension 02/13/2021   Sensorineural hearing loss (SNHL) of both ears 02/13/2021   Cirrhosis of liver without ascites (HCC) 02/13/2021   History of skin cancer 02/13/2021   Aortic atherosclerosis (HCC) 02/13/2021   Hypercalcemia 02/13/2021   Thrombocytopenia (HCC) 02/13/2021   Past Medical History:  Diagnosis Date   Arthritis    Hepatitis B     Hypertension     Family History  Problem Relation Age of Onset   Stroke Mother    Cancer Father        Brain   Healthy Daughter    Healthy Daughter    Healthy Son     Past Surgical History:  Procedure Laterality Date   CHOLECYSTECTOMY     TOTAL HIP ARTHROPLASTY Right    Social History   Occupational History   Not on file  Tobacco Use   Smoking status: Former    Packs/day: 0.50    Years: 40.00    Total pack years: 20.00    Types: Cigarettes    Quit date: 2000    Years since quitting: 23.5   Smokeless tobacco: Never  Vaping Use   Vaping Use: Never used  Substance and Sexual Activity   Alcohol use: Never   Drug use: Never   Sexual activity: Not on file

## 2021-11-30 NOTE — Telephone Encounter (Signed)
Patient was told to get get injection when she comes back In 6 months appt already made for 06/02/22 at 8:15 please advise

## 2021-12-01 ENCOUNTER — Ambulatory Visit: Payer: Medicare Other | Admitting: Orthopedic Surgery

## 2021-12-01 MED ORDER — LIDOCAINE HCL 1 % IJ SOLN
2.0000 mL | INTRAMUSCULAR | Status: AC | PRN
  Administered 2021-11-30: 2 mL

## 2021-12-01 MED ORDER — HYLAN G-F 20 48 MG/6ML IX SOSY
48.0000 mg | PREFILLED_SYRINGE | INTRA_ARTICULAR | Status: AC | PRN
  Administered 2021-11-30: 48 mg via INTRA_ARTICULAR

## 2021-12-01 MED ORDER — BUPIVACAINE HCL 0.25 % IJ SOLN
2.0000 mL | INTRAMUSCULAR | Status: AC | PRN
  Administered 2021-11-30: 2 mL via INTRA_ARTICULAR

## 2021-12-03 ENCOUNTER — Other Ambulatory Visit: Payer: Self-pay | Admitting: Orthopedic Surgery

## 2021-12-03 DIAGNOSIS — I10 Essential (primary) hypertension: Secondary | ICD-10-CM

## 2021-12-14 ENCOUNTER — Ambulatory Visit (HOSPITAL_COMMUNITY): Payer: Medicare Other | Attending: Cardiology

## 2021-12-14 DIAGNOSIS — I10 Essential (primary) hypertension: Secondary | ICD-10-CM | POA: Diagnosis not present

## 2021-12-14 DIAGNOSIS — I700 Atherosclerosis of aorta: Secondary | ICD-10-CM | POA: Insufficient documentation

## 2021-12-14 DIAGNOSIS — M7989 Other specified soft tissue disorders: Secondary | ICD-10-CM | POA: Insufficient documentation

## 2021-12-14 DIAGNOSIS — R609 Edema, unspecified: Secondary | ICD-10-CM | POA: Diagnosis present

## 2021-12-14 LAB — ECHOCARDIOGRAM COMPLETE
Area-P 1/2: 4.8 cm2
S' Lateral: 2.7 cm

## 2021-12-22 ENCOUNTER — Encounter: Payer: Self-pay | Admitting: *Deleted

## 2022-01-12 ENCOUNTER — Other Ambulatory Visit: Payer: Self-pay

## 2022-01-12 DIAGNOSIS — G8929 Other chronic pain: Secondary | ICD-10-CM

## 2022-01-12 MED ORDER — TRAMADOL HCL 50 MG PO TABS: ORAL_TABLET | ORAL | 0 refills | Status: DC

## 2022-01-12 NOTE — Telephone Encounter (Signed)
Patient request refill on Tramadol 50 mg tablet.  Medication pended and sent to [name], NP for approval.

## 2022-02-16 ENCOUNTER — Other Ambulatory Visit: Payer: Self-pay

## 2022-02-16 DIAGNOSIS — G8929 Other chronic pain: Secondary | ICD-10-CM

## 2022-02-16 MED ORDER — TRAMADOL HCL 50 MG PO TABS: ORAL_TABLET | ORAL | 0 refills | Status: DC

## 2022-02-16 NOTE — Telephone Encounter (Signed)
Patient is requesting a refill of the following medications: Requested Prescriptions   Pending Prescriptions Disp Refills   traMADol (ULTRAM) 50 MG tablet 60 tablet 0    Sig: TAKE 1 TABLET(50 MG) BY MOUTH TWICE DAILY AS NEEDED    Date of last refill: 01/12/2022  Refill amount: 60  Treatment agreement date: 06/02/2021  I called patients daughter to schedule her a follow-up appointment as she does not have one pending and Melissa Fletcher (patients daughter) stated Melissa Alanis, NP told them to cancel routine visit for July 2023, as she did everything she needed to do at the appointment on 11/10/2021.

## 2022-02-28 ENCOUNTER — Other Ambulatory Visit: Payer: Self-pay | Admitting: Orthopedic Surgery

## 2022-02-28 DIAGNOSIS — M25473 Effusion, unspecified ankle: Secondary | ICD-10-CM

## 2022-03-02 ENCOUNTER — Other Ambulatory Visit: Payer: Self-pay | Admitting: Orthopedic Surgery

## 2022-03-02 DIAGNOSIS — M25473 Effusion, unspecified ankle: Secondary | ICD-10-CM

## 2022-03-27 ENCOUNTER — Other Ambulatory Visit: Payer: Self-pay

## 2022-03-27 DIAGNOSIS — G8929 Other chronic pain: Secondary | ICD-10-CM

## 2022-03-27 MED ORDER — TRAMADOL HCL 50 MG PO TABS: ORAL_TABLET | ORAL | 0 refills | Status: DC

## 2022-03-27 NOTE — Telephone Encounter (Signed)
Patient called requesting refill on Tramadol 50 mg tablet.Medication last refilled 02/16/2022 and contract up to date. I called and left message for patient's daughter, [name], to call the office to schedule appointment.  Medication pended and sent to Windell Moulding, Np for approval.

## 2022-03-30 ENCOUNTER — Other Ambulatory Visit: Payer: Self-pay | Admitting: Orthopedic Surgery

## 2022-03-30 DIAGNOSIS — M25473 Effusion, unspecified ankle: Secondary | ICD-10-CM

## 2022-03-30 MED ORDER — FUROSEMIDE 20 MG PO TABS: ORAL_TABLET | ORAL | 0 refills | Status: DC

## 2022-03-30 NOTE — Telephone Encounter (Signed)
Patient has request refill on medication Potassium Chloride. Patient last refill dated 03/02/2022. Patient was given 30 tablets with no refills. Im unsure if this is ongoing medication for patient. Medication pend and sent to PCP Octavia Heir, NP for approval.

## 2022-03-30 NOTE — Telephone Encounter (Signed)
Called patient and no answer. Voicemail was left with office call back number.   

## 2022-03-30 NOTE — Telephone Encounter (Signed)
Patient daughter [name] called back and states that patient is still taking Furosemide and needs refill on Potassium. Message routed back to PCP Octavia Heir, NP

## 2022-03-30 NOTE — Telephone Encounter (Signed)
This medication is "as needed" and only to be given when she takes furosemide for ankle swelling. Can we clarify how she is taking medication before refilling.

## 2022-04-06 ENCOUNTER — Encounter: Payer: Self-pay | Admitting: Orthopedic Surgery

## 2022-04-06 ENCOUNTER — Ambulatory Visit (INDEPENDENT_AMBULATORY_CARE_PROVIDER_SITE_OTHER): Payer: Medicare Other | Admitting: Orthopedic Surgery

## 2022-04-06 VITALS — BP 132/86 | HR 60 | Resp 18 | Ht 62.0 in | Wt 159.1 lb

## 2022-04-06 DIAGNOSIS — R413 Other amnesia: Secondary | ICD-10-CM

## 2022-04-06 DIAGNOSIS — I10 Essential (primary) hypertension: Secondary | ICD-10-CM | POA: Diagnosis not present

## 2022-04-06 DIAGNOSIS — M25561 Pain in right knee: Secondary | ICD-10-CM | POA: Diagnosis not present

## 2022-04-06 DIAGNOSIS — G8929 Other chronic pain: Secondary | ICD-10-CM

## 2022-04-06 DIAGNOSIS — M25473 Effusion, unspecified ankle: Secondary | ICD-10-CM

## 2022-04-06 MED ORDER — TRAMADOL HCL 50 MG PO TABS: ORAL_TABLET | ORAL | 3 refills | Status: DC

## 2022-04-06 NOTE — Progress Notes (Signed)
Careteam: Patient Care Team: Melissa Alanis, NP as PCP - General (Adult Health Nurse Practitioner)  Seen by: Windell Moulding, AGNP-C  PLACE OF SERVICE:  [place] Directive information Does Patient Have a Medical Advance Directive?: No, Would patient like information on creating a medical advance directive?: No - Patient declined  No Known Allergies  Chief Complaint  Patient presents with   Medical Management of Chronic Issues    Patient presents today for a 8 month follow-up   Quality Metric Gaps    To Discuss the following as listed to the Care Gaps as listed below.      HPI: Patient is a 81 y.o. female seen today for medical management of chronic conditions.   Daughter present during encounter.   No health concerns today.   She still is having intermittent ankle edema. She will take furosemide 20 mg when this occurs. She brought her medicine bottles with her today. She has 2 bottles of potassium and only on bottle of furosemide. Medication education given to patient. Daughter plans to count furosemide and potassium and make sure they are even count.   Eating well. Weight stable.   No recent falls. Right knee still very painful. Ambulates on her own. She continues to take Tramadol 1-2x/daily.   Remains forgetful. MMSE 29/30 02/2021. No behaviors per daughter.    Review of Systems:  Review of Systems  Constitutional:  Negative for chills and fever.  HENT:  Positive for hearing loss. Negative for congestion.   Eyes:  Negative for blurred vision and double vision.  Respiratory:  Negative for cough, shortness of breath and wheezing.   Cardiovascular:  Positive for leg swelling. Negative for chest pain.  Gastrointestinal:  Negative for abdominal pain, blood in stool, constipation, diarrhea, heartburn, nausea and vomiting.  Genitourinary:  Positive for frequency. Negative for dysuria.  Musculoskeletal:  Positive for joint pain. Negative for falls.  Skin:  Negative  for rash.  Neurological:  Positive for weakness. Negative for dizziness and headaches.  Psychiatric/Behavioral:  Negative for depression.     Past Medical History:  Diagnosis Date   Arthritis    Hepatitis B    Hypertension    Past Surgical History:  Procedure Laterality Date   CHOLECYSTECTOMY     TOTAL HIP ARTHROPLASTY Right    Social History:   reports that she quit smoking about 23 years ago. Her smoking use included cigarettes. She has a 20.00 pack-year smoking history. She has never used smokeless tobacco. She reports that she does not drink alcohol and does not use drugs.  Family History  Problem Relation Age of Onset   Stroke Mother    Cancer Father        Brain   Healthy Daughter    Healthy Daughter    Healthy Son     Medications: Patient's Medications  New Prescriptions   No medications on file  Previous Medications   ATENOLOL (TENORMIN) 50 MG TABLET    Take 1 tablet (50 mg total) by mouth daily.   CHOLECALCIFEROL (VITAMIN D3 PO)    Take 10 mg by mouth.   FUROSEMIDE (LASIX) 20 MG TABLET    TAKE 1 TABLET(20 MG) BY MOUTH DAILY AS NEEDED FOR ANKLE SWELLING   LIDOCAINE (LIDODERM) 5 %    Place 1 patch onto the skin daily. Remove & Discard patch within 12 hours or as directed by MD   MUPIROCIN CREAM (BACTROBAN) 2 %    Apply 1 application. topically  2 (two) times daily.   POTASSIUM CHLORIDE (KLOR-CON) 10 MEQ TABLET    TAKE 1 TABLET(10 MEQ) BY MOUTH DAILY WITH FUROSEMIDE AS NEEDED   TRAMADOL (ULTRAM) 50 MG TABLET    TAKE 1 TABLET(50 MG) BY MOUTH TWICE DAILY AS NEEDED   VALSARTAN (DIOVAN) 160 MG TABLET    TAKE 1 TABLET(160 MG) BY MOUTH DAILY  Modified Medications   No medications on file  Discontinued Medications   No medications on file    Physical Exam:  Vitals:   04/06/22 1349  BP: 132/86  Pulse: 60  Resp: 18  SpO2: 97%  Weight: 159 lb 2 oz (72.2 kg)  Height: _0  (1.575 m)   Body mass index is 29.1 kg/m. Wt Readings from Last 3 Encounters:  04/06/22  159 lb 2 oz (72.2 kg)  11/29/21 162 lb 3.2 oz (73.6 kg)  11/10/21 164 lb 9.6 oz (74.7 kg)    Physical Exam Vitals reviewed.  Constitutional:      General: She is not in acute distress. HENT:     Head: Normocephalic.  Eyes:     General:        Right eye: No discharge.        Left eye: No discharge.  Cardiovascular:     Rate and Rhythm: Normal rate and regular rhythm.     Pulses: Normal pulses.     Heart sounds: Normal heart sounds.  Pulmonary:     Effort: Pulmonary effort is normal. No respiratory distress.     Breath sounds: Normal breath sounds. No wheezing.  Abdominal:     General: Bowel sounds are normal. There is no distension.     Palpations: Abdomen is soft.     Tenderness: There is no abdominal tenderness.  Musculoskeletal:     Cervical back: Neck supple.     Right lower leg: No edema.     Left lower leg: No edema.  Skin:    General: Skin is warm and dry.     Capillary Refill: Capillary refill takes less than 2 seconds.  Neurological:     General: No focal deficit present.     Mental Status: She is alert and oriented to person, place, and time.     Motor: Weakness present.     Gait: Gait abnormal.  Psychiatric:        Mood and Affect: Mood normal.        Behavior: Behavior normal.     Labs reviewed: Basic Metabolic Panel: Recent Labs    06/02/21 0908 06/08/21 1252 06/08/21 1301  NA 142 135 139  K 4.2 4.1 4.2  CL 107 102 103  CO2 31 28  --   GLUCOSE 95 104* 104*  BUN _1 CREATININE 0.72 0.68 0.70  CALCIUM 10.2 9.9  --    Liver Function Tests: Recent Labs    06/02/21 0908 06/08/21 1252  AST 27 41  ALT 12 20  ALKPHOS  --  92  BILITOT 0.6 0.8  PROT 7.4 8.2*  ALBUMIN  --  3.6   No results for input(s): "LIPASE", "AMYLASE" in the last 8760 hours. No results for input(s): "AMMONIA" in the last 8760 hours. CBC: Recent Labs    06/02/21 0908 06/08/21 1252 06/08/21 1301 08/09/21 0806  WBC 3.0* 3.5*  --  3.9  NEUTROABS 1,812 2.5  --   2,048  HGB 11.9 12.5 11.9* 11.8  HCT 36.4 37.8 35.0* 36.9  MCV 92.6 94.5  --  94.9  PLT 119* 90*  --  95*   Lipid Panel: No results for input(s): "CHOL", "HDL", "LDLCALC", "TRIG", "CHOLHDL", "LDLDIRECT" in the last 8760 hours. TSH: No results for input(s): "TSH" in the last 8760 hours. A1C: No results found for: "HGBA1C"   Assessment/Plan 1. Chronic pain of right knee - ongoing - 01/2021 right knee with tricompartmental narrowing and severe narrowing of lateral compartment - no recent falls - abnormal gait - cont voltaren gel  - traMADol (ULTRAM) 50 MG tablet; TAKE 1 TABLET(50 MG) BY MOUTH TWICE DAILY AS NEEDED  Dispense: 60 tablet; Refill: 3  2. Essential hypertension - controlled - cont losartan and atenolol - CBC with Differential/Platelet - CMP with eGFR([facility])  3. Ankle edema - intermittent edema - se had more potassium bottles with her than furosemide - advised daughter to make sure equal number of furosemide/potassium  4. Impaired memory - MMSE 29/30 - CT head 09/2020 noted chronic microvascular ischemic changes  - no behaviors - not on medication  Future labs/tests: MMSE, sign controlled substances form next encounter  Total time: 31 minutes. Greater than 50% of total time spent doing patient education regarding health maintenance, HTN, ankle edema, chronic knee pain, and medication management.    Next appt: Visit date not found  [facility], [facility] [phone]

## 2022-04-06 NOTE — Patient Instructions (Signed)
Count furosemide/potassium pills and make sure they are the same count  Refill for Tramadol sent in

## 2022-04-07 LAB — CBC WITH DIFFERENTIAL/PLATELET
Absolute Monocytes: 350 cells/uL (ref 200–950)
Basophils Absolute: 50 cells/uL (ref 0–200)
Basophils Relative: 1.5 %
Eosinophils Absolute: 208 cells/uL (ref 15–500)
Eosinophils Relative: 6.3 %
HCT: 34.2 % — ABNORMAL LOW (ref 35.0–45.0)
Hemoglobin: 11.5 g/dL — ABNORMAL LOW (ref 11.7–15.5)
Lymphs Abs: 828 cells/uL — ABNORMAL LOW (ref 850–3900)
MCH: 31.4 pg (ref 27.0–33.0)
MCHC: 33.6 g/dL (ref 32.0–36.0)
MCV: 93.4 fL (ref 80.0–100.0)
MPV: 10.9 fL (ref 7.5–12.5)
Monocytes Relative: 10.6 %
Neutro Abs: 1865 cells/uL (ref 1500–7800)
Neutrophils Relative %: 56.5 %
Platelets: 96 10*3/uL — ABNORMAL LOW (ref 140–400)
RBC: 3.66 10*6/uL — ABNORMAL LOW (ref 3.80–5.10)
RDW: 11.8 % (ref 11.0–15.0)
Total Lymphocyte: 25.1 %
WBC: 3.3 10*3/uL — ABNORMAL LOW (ref 3.8–10.8)

## 2022-04-07 LAB — COMPLETE METABOLIC PANEL WITH GFR
AG Ratio: 0.9 (calc) — ABNORMAL LOW (ref 1.0–2.5)
ALT: 11 U/L (ref 6–29)
AST: 26 U/L (ref 10–35)
Albumin: 3.6 g/dL (ref 3.6–5.1)
Alkaline phosphatase (APISO): 93 U/L (ref 37–153)
BUN: 14 mg/dL (ref 7–25)
CO2: 28 mmol/L (ref 20–32)
Calcium: 10 mg/dL (ref 8.6–10.4)
Chloride: 103 mmol/L (ref 98–110)
Creat: 0.73 mg/dL (ref 0.60–0.95)
Globulin: 3.8 g/dL (calc) — ABNORMAL HIGH (ref 1.9–3.7)
Glucose, Bld: 80 mg/dL (ref 65–139)
Potassium: 4.1 mmol/L (ref 3.5–5.3)
Sodium: 139 mmol/L (ref 135–146)
Total Bilirubin: 0.7 mg/dL (ref 0.2–1.2)
Total Protein: 7.4 g/dL (ref 6.1–8.1)
eGFR: 82 mL/min/{1.73_m2} (ref >=60)

## 2022-04-17 ENCOUNTER — Other Ambulatory Visit: Payer: Self-pay

## 2022-04-17 DIAGNOSIS — I10 Essential (primary) hypertension: Secondary | ICD-10-CM

## 2022-04-17 MED ORDER — ATENOLOL 50 MG PO TABS: 50.0000 mg | ORAL_TABLET | Freq: Every day | ORAL | 1 refills | Status: DC

## 2022-04-17 MED ORDER — VALSARTAN 160 MG PO TABS: 160.0000 mg | ORAL_TABLET | Freq: Every day | ORAL | 1 refills | Status: DC

## 2022-04-26 ENCOUNTER — Other Ambulatory Visit: Payer: Self-pay | Admitting: Orthopedic Surgery

## 2022-04-26 DIAGNOSIS — M25473 Effusion, unspecified ankle: Secondary | ICD-10-CM

## 2022-05-01 ENCOUNTER — Other Ambulatory Visit: Payer: Self-pay | Admitting: Orthopedic Surgery

## 2022-05-01 ENCOUNTER — Telehealth: Payer: Self-pay

## 2022-05-01 DIAGNOSIS — G8929 Other chronic pain: Secondary | ICD-10-CM

## 2022-05-01 NOTE — Telephone Encounter (Signed)
Daughter ([name]) called and stated pharmacy reported on refill on pt's Tramadol and [name] Fargo,NP denied refill request. Patient's daughter ([name]) was advised that [name] fargo,NP send in on 04/06/22 refill with 3 additional refills at last visit and pharmacy confirmed receiving the refill. [name] states she will call the pharmacy back.

## 2022-06-01 ENCOUNTER — Telehealth: Payer: Self-pay

## 2022-06-01 NOTE — Telephone Encounter (Signed)
VOB submitted for Monovisc, right knee. 

## 2022-06-01 NOTE — Telephone Encounter (Signed)
-----   Message from Melissa Fletcher, RT sent at 06/01/2022  2:51 PM EST ----- Looks like patient is scheduled for possible gel injection on Tuesday.  Is she okay to get this?

## 2022-06-01 NOTE — Telephone Encounter (Signed)
Not sure if you received the other message, but yes she will get gel injection.

## 2022-06-02 ENCOUNTER — Ambulatory Visit: Payer: Medicare Other | Admitting: Orthopaedic Surgery

## 2022-06-05 ENCOUNTER — Other Ambulatory Visit: Payer: Self-pay

## 2022-06-05 DIAGNOSIS — M1711 Unilateral primary osteoarthritis, right knee: Secondary | ICD-10-CM

## 2022-06-06 ENCOUNTER — Encounter: Payer: Self-pay | Admitting: Orthopaedic Surgery

## 2022-06-06 ENCOUNTER — Ambulatory Visit (INDEPENDENT_AMBULATORY_CARE_PROVIDER_SITE_OTHER): Payer: Medicare Other | Admitting: Orthopaedic Surgery

## 2022-06-06 DIAGNOSIS — M1711 Unilateral primary osteoarthritis, right knee: Secondary | ICD-10-CM | POA: Diagnosis not present

## 2022-06-06 MED ORDER — HYALURONAN 88 MG/4ML IX SOSY
88.0000 mg | PREFILLED_SYRINGE | INTRA_ARTICULAR | Status: AC | PRN
  Administered 2022-06-06: 88 mg via INTRA_ARTICULAR

## 2022-06-06 NOTE — Progress Notes (Signed)
Office Visit Note   Patient: Melissa Fletcher           Date of Birth: [birth year]           MRN: [id number] Visit Date: 06/06/2022              Requested by: Octavia Heir, NP [phone] [address] [place],  [place] [address] PCP: Octavia Heir, NP   Assessment & Plan: Visit Diagnoses:  1. Unilateral primary osteoarthritis, right knee     Plan: Impression is right knee osteoarthritis.  Right knee Monovisc injection performed today.  She tolerated this well.  We have discussed following up in 3 months for cortisone injection if needed. Monovisc injection: Expiration date 12/19/2024 Lot #[id number]  Follow-Up Instructions: Return if symptoms worsen or fail to improve.   Orders:  No orders of the defined types were placed in this encounter.  No orders of the defined types were placed in this encounter.     Procedures: Large Joint Inj: R knee on 06/06/2022 7:35 PM Indications: pain Details: 22 G needle  Arthrogram: No  Medications: 88 mg Hyaluronan 88 MG/4ML Outcome: tolerated well, no immediate complications Patient was prepped and draped in the usual sterile fashion.       Clinical Data: No additional findings.   Subjective: Chief Complaint  Patient presents with   Right Knee - Pain    Monovisc Injection    HPI patient is a very pleasant 82 year old female who comes in today for right knee Monovisc injection.  She last had Monovisc injection about 6 months ago which provided good relief for about 4 months.  Her symptoms have returned and have progressively worsened.     Objective: Vital Signs: There were no vitals taken for this visit.    Ortho Exam right knee exam shows trace effusion.  Otherwise unchanged.  Specialty Comments:  No specialty comments available.  Imaging: No new imaging   PMFS History: Patient Active Problem List   Diagnosis Date Noted   Right hip pain 02/13/2021   Primary hypertension 02/13/2021   Sensorineural hearing loss (SNHL)  of both ears 02/13/2021   Cirrhosis of liver without ascites (HCC) 02/13/2021   History of skin cancer 02/13/2021   Aortic atherosclerosis (HCC) 02/13/2021   Hypercalcemia 02/13/2021   Thrombocytopenia (HCC) 02/13/2021   Past Medical History:  Diagnosis Date   Arthritis    Hepatitis B    Hypertension     Family History  Problem Relation Age of Onset   Stroke Mother    Cancer Father        Brain   Healthy Daughter    Healthy Daughter    Healthy Son     Past Surgical History:  Procedure Laterality Date   CHOLECYSTECTOMY     TOTAL HIP ARTHROPLASTY Right    Social History   Occupational History   Not on file  Tobacco Use   Smoking status: Former    Packs/day: 0.50    Years: 40.00    Total pack years: 20.00    Types: Cigarettes    Quit date: 2000    Years since quitting: 24.0   Smokeless tobacco: Never  Vaping Use   Vaping Use: Never used  Substance and Sexual Activity   Alcohol use: Never   Drug use: Never   Sexual activity: Not on file

## 2022-06-26 ENCOUNTER — Other Ambulatory Visit: Payer: Self-pay | Admitting: Orthopedic Surgery

## 2022-06-26 DIAGNOSIS — I10 Essential (primary) hypertension: Secondary | ICD-10-CM

## 2022-07-27 ENCOUNTER — Encounter: Payer: Medicare Other | Admitting: Orthopedic Surgery

## 2022-08-26 ENCOUNTER — Other Ambulatory Visit: Payer: Self-pay | Admitting: Orthopedic Surgery

## 2022-08-26 DIAGNOSIS — I10 Essential (primary) hypertension: Secondary | ICD-10-CM

## 2022-09-06 ENCOUNTER — Ambulatory Visit (INDEPENDENT_AMBULATORY_CARE_PROVIDER_SITE_OTHER): Payer: Medicare Other | Admitting: Orthopaedic Surgery

## 2022-09-06 ENCOUNTER — Encounter: Payer: Self-pay | Admitting: Orthopaedic Surgery

## 2022-09-06 DIAGNOSIS — M1711 Unilateral primary osteoarthritis, right knee: Secondary | ICD-10-CM

## 2022-09-06 MED ORDER — LIDOCAINE HCL 1 % IJ SOLN
2.0000 mL | INTRAMUSCULAR | Status: AC | PRN
Start: 2022-09-06 — End: 2022-09-06
  Administered 2022-09-06: 2 mL

## 2022-09-06 MED ORDER — METHYLPREDNISOLONE ACETATE 40 MG/ML IJ SUSP
40.0000 mg | INTRAMUSCULAR | Status: AC | PRN
Start: 2022-09-06 — End: 2022-09-06
  Administered 2022-09-06: 40 mg via INTRA_ARTICULAR

## 2022-09-06 MED ORDER — BUPIVACAINE HCL 0.5 % IJ SOLN
2.0000 mL | INTRAMUSCULAR | Status: AC | PRN
Start: 2022-09-06 — End: 2022-09-06
  Administered 2022-09-06: 2 mL via INTRA_ARTICULAR

## 2022-09-06 NOTE — Progress Notes (Signed)
Office Visit Note   Patient: Melissa Fletcher           Date of Birth: 1940/12/22           MRN: [id number] Visit Date: 09/06/2022              Requested by: Octavia Heir, NP [phone] [address] [place],  [place] [address] PCP: Octavia Heir, NP   Assessment & Plan: Visit Diagnoses:  1. Unilateral primary osteoarthritis, right knee     Plan: Impression is right knee osteoarthritis.  Today, we proceeded with right knee cortisone injection for which she tolerated well.  She will follow-up with [place] in 3 months for repeat viscosupplementation injection.  I have discussed with her daughter to give [place] a call a few weeks prior and order to submit for insurance approval.  Follow-Up Instructions: Return in about 3 months (around 12/06/2022) for for visco inj.   Orders:  Orders Placed This Encounter  Procedures  . Large Joint Inj: R knee   No orders of the defined types were placed in this encounter.     Procedures: Large Joint Inj: R knee on 09/06/2022 8:22 AM Indications: pain Details: 22 G needle  Arthrogram: No  Medications: 40 mg methylPREDNISolone acetate 40 MG/ML; 2 mL lidocaine 1 %; 2 mL bupivacaine 0.5 % Consent was given by the patient. Patient was prepped and draped in the usual sterile fashion.      Clinical Data: No additional findings.   Subjective: Chief Complaint  Patient presents with  . Right Knee - Pain    HPI patient is a pleasant 82 year old female who comes in today with recurrent right knee pain.  History of osteoarthritis to the right knee.  She has been alternating cortisone and viscosupplementation injections about every 3 months.  She underwent viscosupplementation injection about 3 months ago which helped until recently.  Symptoms have returned and are constant.  Worse with activity.  She is here today requesting cortisone injection.  Review of Systems as detailed in HPI.  All others reviewed and are negative.   Objective: Vital Signs: There were  no vitals taken for this visit.  Physical Exam well-developed well-nourished female no acute distress.  Alert and oriented x 3.  Ortho Exam stable right knee exam  Specialty Comments:  No specialty comments available.  Imaging: No new imaging   PMFS History: Patient Active Problem List   Diagnosis Date Noted  . Right hip pain 02/13/2021  . Primary hypertension 02/13/2021  . Sensorineural hearing loss (SNHL) of both ears 02/13/2021  . Cirrhosis of liver without ascites 02/13/2021  . History of skin cancer 02/13/2021  . Aortic atherosclerosis 02/13/2021  . Hypercalcemia 02/13/2021  . Thrombocytopenia 02/13/2021   Past Medical History:  Diagnosis Date  . Arthritis   . Hepatitis B   . Hypertension     Family History  Problem Relation Age of Onset  . Stroke Mother   . Cancer Father        Brain  . Healthy Daughter   . Healthy Daughter   . Healthy Son     Past Surgical History:  Procedure Laterality Date  . CHOLECYSTECTOMY    . TOTAL HIP ARTHROPLASTY Right    Social History   Occupational History  . Not on file  Tobacco Use  . Smoking status: Former    Packs/day: 0.50    Years: 40.00    Additional pack years: 0.00    Total pack years: 20.00  Types: Cigarettes    Quit date: 2000    Years since quitting: 24.3  . Smokeless tobacco: Never  Vaping Use  . Vaping Use: Never used  Substance and Sexual Activity  . Alcohol use: Never  . Drug use: Never  . Sexual activity: Not on file

## 2022-09-21 ENCOUNTER — Ambulatory Visit (INDEPENDENT_AMBULATORY_CARE_PROVIDER_SITE_OTHER): Payer: Medicare Other | Admitting: Orthopedic Surgery

## 2022-09-21 ENCOUNTER — Encounter: Payer: Self-pay | Admitting: Orthopedic Surgery

## 2022-09-21 VITALS — BP 138/84 | HR 78 | Temp 98.1°F | Resp 16 | Ht 62.0 in | Wt 149.2 lb

## 2022-09-21 DIAGNOSIS — F03B18 Unspecified dementia, moderate, with other behavioral disturbance: Secondary | ICD-10-CM | POA: Diagnosis not present

## 2022-09-21 DIAGNOSIS — F419 Anxiety disorder, unspecified: Secondary | ICD-10-CM

## 2022-09-21 DIAGNOSIS — G8929 Other chronic pain: Secondary | ICD-10-CM

## 2022-09-21 DIAGNOSIS — I10 Essential (primary) hypertension: Secondary | ICD-10-CM

## 2022-09-21 DIAGNOSIS — G629 Polyneuropathy, unspecified: Secondary | ICD-10-CM

## 2022-09-21 DIAGNOSIS — M25561 Pain in right knee: Secondary | ICD-10-CM

## 2022-09-21 DIAGNOSIS — F422 Mixed obsessional thoughts and acts: Secondary | ICD-10-CM

## 2022-09-21 DIAGNOSIS — R5383 Other fatigue: Secondary | ICD-10-CM

## 2022-09-21 LAB — CBC WITH DIFFERENTIAL/PLATELET
Basophils Relative: 1.3 %
Eosinophils Relative: 5.7 %
Hemoglobin: 12.2 g/dL (ref 11.7–15.5)
MCHC: 33 g/dL (ref 32.0–36.0)
MCV: 93.7 fL (ref 80.0–100.0)
MPV: 11.2 fL (ref 7.5–12.5)
Monocytes Relative: 9.6 %
Total Lymphocyte: 22.1 %

## 2022-09-21 MED ORDER — TRAMADOL HCL 50 MG PO TABS
ORAL_TABLET | ORAL | 3 refills | Status: DC
Start: 2022-09-21 — End: 2022-10-30

## 2022-09-21 MED ORDER — VALSARTAN 160 MG PO TABS: ORAL_TABLET | ORAL | 1 refills | Status: DC

## 2022-09-21 MED ORDER — GABAPENTIN 100 MG PO CAPS
100.0000 mg | ORAL_CAPSULE | Freq: Every day | ORAL | 3 refills | Status: DC
Start: 2022-09-21 — End: 2022-12-18

## 2022-09-21 MED ORDER — SERTRALINE HCL 25 MG PO TABS
25.0000 mg | ORAL_TABLET | Freq: Every day | ORAL | 3 refills | Status: DC
Start: 2022-09-21 — End: 2022-12-18

## 2022-09-21 NOTE — Progress Notes (Signed)
Careteam: Patient Care Team: Octavia Heir, NP as PCP - General (Adult Health Nurse Practitioner)  Seen by: [name], AGNP-C  PLACE OF SERVICE:  [facility]  Advanced Directive information Does Patient Have a Medical Advance Directive?: Yes, Type of Advance Directive: Healthcare Power of [place];Living will;Out of facility DNR (pink MOST or yellow form), Does patient want to make changes to medical advance directive?: No - Patient declined  No Known Allergies  Chief Complaint  Patient presents with   Medical Management of Chronic Issues    6 month follow up.    Health Maintenance    Discuss the need for AWV.    Immunizations    Discuss the need for DTAP vaccine, Shingrix vaccine, and Covid Booster.      HPI: Patient is a 82 y.o. female seen today for medical management of chronic conditions.   Daughter present during encounter today.   Continues to have pain in right knee. Followed by ortho. Alternating gel and cortisone injections every 3 months. Remains on Tramadol.   [place]ives in [place] Very supportive family. MMSE today 11/30, incorrect shapes and sentence. She appears very concerned about veins in her legs. Daughter reports seeing television commercial about vein specialist and has been obsessed about veins in her legs and body ever since. H/o varicose veins. During our encounter she shows concerns about veins on multiple places on her body. She denies pain. She has had increased anxiety for awhile. She also has a pattern where she is very concerned about a particular health condition for awhile. In the past it was dry skin on left ear.  She was prescribed Zoloft but stopped taking it. No agitation reported by daughter.   She reports " feeling like she is walking on water." Concerned with her feet. Sensation sometimes burning. No recent injury to feet.   No recent falls or injuries.   Scheduled for AWV next week.    Review of Systems:  Review of Systems  Constitutional:   Negative for fever.  HENT:  Positive for hearing loss. Negative for sore throat.   Eyes:  Negative for blurred vision.  Respiratory:  Negative for cough, shortness of breath and wheezing.   Cardiovascular:  Negative for chest pain and leg swelling.  Gastrointestinal:  Negative for abdominal pain, constipation and heartburn.  Genitourinary:  Negative for dysuria.  Musculoskeletal:  Positive for joint pain. Negative for falls.  Skin:  Negative for rash.  Neurological:  Negative for dizziness, weakness and headaches.  Psychiatric/Behavioral:  Positive for memory loss. Negative for depression. The patient is nervous/anxious. The patient does not have insomnia.     Past Medical History:  Diagnosis Date   Arthritis    Hepatitis B    Hypertension    Past Surgical History:  Procedure Laterality Date   CHOLECYSTECTOMY     TOTAL HIP ARTHROPLASTY Right    Social History:   reports that she quit smoking about 24 years ago. Her smoking use included cigarettes. She has a 20.00 pack-year smoking history. She has never used smokeless tobacco. She reports that she does not drink alcohol and does not use drugs.  Family History  Problem Relation Age of Onset   Stroke Mother    Cancer Father        Brain   Healthy Daughter    Healthy Daughter    Healthy Son     Medications: Patient's Medications  New Prescriptions   No medications on file  Previous Medications  ATENOLOL (TENORMIN) 50 MG TABLET    Take 1 tablet (50 mg total) by mouth daily.   CHOLECALCIFEROL (VITAMIN D3 PO)    Take 10 mg by mouth.   FUROSEMIDE (LASIX) 20 MG TABLET    TAKE 1 TABLET(20 MG) BY MOUTH DAILY AS NEEDED FOR ANKLE SWELLING   LIDOCAINE (LIDODERM) 5 %    Place 1 patch onto the skin daily. Remove & Discard patch within 12 hours or as directed by MD   MUPIROCIN CREAM (BACTROBAN) 2 %    Apply 1 application. topically 2 (two) times daily.   POTASSIUM CHLORIDE (KLOR-CON) 10 MEQ TABLET    TAKE 1 TABLET(10 MEQ) BY MOUTH  DAILY WITH FUROSEMIDE AS NEEDED   TRAMADOL (ULTRAM) 50 MG TABLET    TAKE 1 TABLET(50 MG) BY MOUTH TWICE DAILY AS NEEDED   VALSARTAN (DIOVAN) 160 MG TABLET    TAKE 1 TABLET(160 MG) BY MOUTH DAILY  Modified Medications   No medications on file  Discontinued Medications   No medications on file    Physical Exam:  Vitals:   09/21/22 0917  BP: (!) 142/88  Pulse: 78  Resp: 16  Temp: 98.1 F (36.7 C)  SpO2: 93%  Weight: 149 lb 3.2 oz (67.7 kg)  Height: 5\' 2"  (1.575 m)   Body mass index is 27.29 kg/m. Wt Readings from Last 3 Encounters:  09/21/22 149 lb 3.2 oz (67.7 kg)  04/06/22 159 lb 2 oz (72.2 kg)  11/29/21 162 lb 3.2 oz (73.6 kg)    Physical Exam Vitals reviewed.  Constitutional:      General: She is not in acute distress. HENT:     Head: Normocephalic.     Ears:     Comments: Bilateral hearing aids Eyes:     General:        Right eye: No discharge.        Left eye: No discharge.  Cardiovascular:     Rate and Rhythm: Normal rate and regular rhythm.     Pulses:          Dorsalis pedis pulses are 1+ on the right side and 1+ on the left side.       Posterior tibial pulses are 1+ on the right side and 1+ on the left side.     Heart sounds: Normal heart sounds.  Pulmonary:     Effort: Pulmonary effort is normal. No respiratory distress.     Breath sounds: Normal breath sounds. No wheezing.  Abdominal:     General: Bowel sounds are normal. There is no distension.     Palpations: Abdomen is soft.     Tenderness: There is no abdominal tenderness.  Musculoskeletal:     Cervical back: Neck supple.     Right lower leg: No edema.     Left lower leg: No edema.  Feet:     Right foot:     Protective Sensation: 10 sites tested.  10 sites sensed.     Skin integrity: Skin integrity normal.     Toenail Condition: Right toenails are normal.     Left foot:     Protective Sensation: 10 sites tested.  10 sites sensed.     Skin integrity: Skin integrity normal.     Toenail  Condition: Left toenails are normal.  Skin:    General: Skin is warm.     Capillary Refill: Capillary refill takes less than 2 seconds.  Neurological:     General: No focal deficit present.  Mental Status: She is alert and oriented to person, place, and time.  Psychiatric:        Mood and Affect: Mood normal.        Behavior: Behavior normal.     Labs reviewed: Basic Metabolic Panel: Recent Labs    04/06/22 1440  NA 139  K 4.1  CL 103  CO2 28  GLUCOSE 80  BUN 14  CREATININE 0.73  CALCIUM 10.0   Liver Function Tests: Recent Labs    04/06/22 1440  AST 26  ALT 11  BILITOT 0.7  PROT 7.4   No results for input(s): "LIPASE", "AMYLASE" in the last 8760 hours. No results for input(s): "AMMONIA" in the last 8760 hours. CBC: Recent Labs    04/06/22 1440  WBC 3.3*  NEUTROABS 1,865  HGB 11.5*  HCT 34.2*  MCV 93.4  PLT 96*   Lipid Panel: No results for input(s): "CHOL", "HDL", "LDLCALC", "TRIG", "CHOLHDL", "LDLDIRECT" in the last 8760 hours. TSH: No results for input(s): "TSH" in the last 8760 hours. A1C: No results found for: "HGBA1C"   Assessment/Plan 1. Chronic pain of right knee - ongoing - followed by ortho - 01/2021 xray with tricompartmental narrowing and severe narrowing of lateral compartment - alternating gel injections with cortisone - no recent falls  - traMADol (ULTRAM) 50 MG tablet; TAKE 1 TABLET(50 MG) BY MOUTH TWICE DAILY AS NEEDED  Dispense: 60 tablet; Refill: 3  2. Essential hypertension - controlled - cont valsartan - valsartan (DIOVAN) 160 MG tablet; TAKE 1 TABLET(160 MG) BY MOUTH DAILY  Dispense: 90 tablet; Refill: 1 - CBC with Differential/Platelet - Complete Metabolic Panel with eGFR  3. 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 Polyneuropathy - includes lower feet - h/o varicose veins  - gabapentin (NEURONTIN) 100 MG capsule; Take 1 capsule (100 mg total) by mouth at bedtime.  Dispense: 30 capsule; Refill: 3  5. Fatigue, unspecified type - suspect due to advanced age - TSH  6. Anxiety - very anxious today - no recent panic attacks - Na+ 139 03/2022 - sertraline (ZOLOFT) 25 MG tablet; Take 1 tablet (25 mg total) by mouth daily.  Dispense: 30 tablet; Refill: 3  7. Mixed obsessional thoughts and acts - pattern of constantly worrying about certain health alinement - see above - sertraline (ZOLOFT) 25 MG tablet; Take 1 tablet (25 mg total) by mouth daily.  Dispense: 30 tablet; Refill: 3  . Total time: 36 minutes. Greater than 50% of total time spent doing patient education regarding health maintenance, HTN, anxiety, memory impairment, and neuropathy.   Next appt: 10/02/2022  [name], Juel Burrow  [facility] [phone]

## 2022-09-21 NOTE — Patient Instructions (Signed)
Please try gabapentin 100 mg every night

## 2022-09-22 LAB — COMPLETE METABOLIC PANEL WITH GFR
AG Ratio: 0.9 (calc) — ABNORMAL LOW (ref 1.0–2.5)
ALT: 16 U/L (ref 6–29)
AST: 29 U/L (ref 10–35)
Albumin: 3.6 g/dL (ref 3.6–5.1)
Alkaline phosphatase (APISO): 99 U/L (ref 37–153)
BUN: 15 mg/dL (ref 7–25)
CO2: 29 mmol/L (ref 20–32)
Calcium: 10.3 mg/dL (ref 8.6–10.4)
Chloride: 105 mmol/L (ref 98–110)
Creat: 0.74 mg/dL (ref 0.60–0.95)
Globulin: 4.1 g/dL (calc) — ABNORMAL HIGH (ref 1.9–3.7)
Glucose, Bld: 91 mg/dL (ref 65–99)
Potassium: 4 mmol/L (ref 3.5–5.3)
Sodium: 140 mmol/L (ref 135–146)
Total Bilirubin: 0.7 mg/dL (ref 0.2–1.2)
Total Protein: 7.7 g/dL (ref 6.1–8.1)
eGFR: 80 mL/min/{1.73_m2} (ref >=60)

## 2022-09-22 LAB — CBC WITH DIFFERENTIAL/PLATELET
Absolute Monocytes: 374 cells/uL (ref 200–950)
Basophils Absolute: 51 cells/uL (ref 0–200)
Eosinophils Absolute: 222 cells/uL (ref 15–500)
HCT: 37 % (ref 35.0–45.0)
Lymphs Abs: 862 cells/uL (ref 850–3900)
MCH: 30.9 pg (ref 27.0–33.0)
Neutro Abs: 2391 cells/uL (ref 1500–7800)
Neutrophils Relative %: 61.3 %
Platelets: 94 10*3/uL — ABNORMAL LOW (ref 140–400)
RBC: 3.95 10*6/uL (ref 3.80–5.10)
RDW: 12.2 % (ref 11.0–15.0)
WBC: 3.9 10*3/uL (ref 3.8–10.8)

## 2022-09-28 ENCOUNTER — Encounter: Payer: Medicare Other | Admitting: Orthopedic Surgery

## 2022-09-28 ENCOUNTER — Ambulatory Visit: Payer: Medicare Other | Admitting: Orthopedic Surgery

## 2022-10-02 ENCOUNTER — Encounter: Payer: Medicare Other | Admitting: Orthopedic Surgery

## 2022-10-19 ENCOUNTER — Other Ambulatory Visit: Payer: Self-pay | Admitting: Orthopedic Surgery

## 2022-10-19 DIAGNOSIS — M25473 Effusion, unspecified ankle: Secondary | ICD-10-CM

## 2022-10-24 ENCOUNTER — Emergency Department (HOSPITAL_BASED_OUTPATIENT_CLINIC_OR_DEPARTMENT_OTHER)
Admission: EM | Admit: 2022-10-24 | Discharge: 2022-10-24 | Disposition: A | Discharge status: Home / Self Care | Payer: Medicare Other | Attending: Emergency Medicine | Admitting: Emergency Medicine

## 2022-10-24 ENCOUNTER — Other Ambulatory Visit: Payer: Self-pay

## 2022-10-24 ENCOUNTER — Encounter (HOSPITAL_BASED_OUTPATIENT_CLINIC_OR_DEPARTMENT_OTHER): Payer: Self-pay

## 2022-10-24 ENCOUNTER — Emergency Department (HOSPITAL_BASED_OUTPATIENT_CLINIC_OR_DEPARTMENT_OTHER): Payer: Medicare Other | Admitting: Radiology

## 2022-10-24 ENCOUNTER — Emergency Department (HOSPITAL_BASED_OUTPATIENT_CLINIC_OR_DEPARTMENT_OTHER): Payer: Medicare Other

## 2022-10-24 DIAGNOSIS — M79604 Pain in right leg: Secondary | ICD-10-CM

## 2022-10-24 DIAGNOSIS — M79605 Pain in left leg: Secondary | ICD-10-CM | POA: Insufficient documentation

## 2022-10-24 DIAGNOSIS — M7989 Other specified soft tissue disorders: Secondary | ICD-10-CM | POA: Diagnosis present

## 2022-10-24 LAB — CBC WITH DIFFERENTIAL/PLATELET
Abs Immature Granulocytes: 0.01 10*3/uL (ref 0.00–0.07)
Basophils Absolute: 0 10*3/uL (ref 0.0–0.1)
Basophils Relative: 1 %
Eosinophils Absolute: 0.3 10*3/uL (ref 0.0–0.5)
Eosinophils Relative: 7 %
HCT: 37.6 % (ref 36.0–46.0)
Hemoglobin: 12.4 g/dL (ref 12.0–15.0)
Immature Granulocytes: 0 %
Lymphocytes Relative: 29 %
Lymphs Abs: 1.1 10*3/uL (ref 0.7–4.0)
MCH: 31.7 pg (ref 26.0–34.0)
MCHC: 33 g/dL (ref 30.0–36.0)
MCV: 96.2 fL (ref 80.0–100.0)
Monocytes Absolute: 0.5 10*3/uL (ref 0.1–1.0)
Monocytes Relative: 12 %
Neutro Abs: 1.9 10*3/uL (ref 1.7–7.7)
Neutrophils Relative %: 51 %
Platelets: 99 10*3/uL — ABNORMAL LOW (ref 150–400)
RBC: 3.91 MIL/uL (ref 3.87–5.11)
RDW: 13.2 % (ref 11.5–15.5)
WBC: 3.8 10*3/uL — ABNORMAL LOW (ref 4.0–10.5)
nRBC: 0 % (ref 0.0–0.2)

## 2022-10-24 LAB — HEPATIC FUNCTION PANEL
ALT: 15 U/L (ref 0–44)
AST: 33 U/L (ref 15–41)
Albumin: 3.8 g/dL (ref 3.5–5.0)
Alkaline Phosphatase: 99 U/L (ref 38–126)
Bilirubin, Direct: 0.2 mg/dL (ref 0.0–0.2)
Indirect Bilirubin: 0.4 mg/dL (ref 0.3–0.9)
Total Bilirubin: 0.6 mg/dL (ref 0.3–1.2)
Total Protein: 7.7 g/dL (ref 6.5–8.1)

## 2022-10-24 LAB — BASIC METABOLIC PANEL
Anion gap: 5 (ref 5–15)
BUN: 13 mg/dL (ref 8–23)
CO2: 30 mmol/L (ref 22–32)
Calcium: 10.4 mg/dL — ABNORMAL HIGH (ref 8.9–10.3)
Chloride: 105 mmol/L (ref 98–111)
Creatinine, Ser: 0.63 mg/dL (ref 0.44–1.00)
GFR, Estimated: >60 mL/min (ref >60)
Glucose, Bld: 85 mg/dL (ref 70–99)
Potassium: 3.9 mmol/L (ref 3.5–5.1)
Sodium: 140 mmol/L (ref 135–145)

## 2022-10-24 LAB — MAGNESIUM: Magnesium: 1.7 mg/dL (ref 1.7–2.4)

## 2022-10-24 NOTE — ED Triage Notes (Signed)
Patient here POV from Home.  Endorses 3 Weeks ago, a can fell onto her Bilateral Feet causing Pain. Swelling and Pain over the Past week (Left Worse).  NAD noted during Triage. A&Ox4. Gcs 15. BIB Wheelchair.

## 2022-10-24 NOTE — ED Notes (Signed)
[place] just left room

## 2022-10-24 NOTE — ED Provider Notes (Signed)
[place] EMERGENCY DEPARTMENT AT [facility] Provider Note   CSN: [id number] Arrival date & time: 10/24/22  1641     History  Chief Complaint  Patient presents with   Foot Injury    Melissa Fletcher is a 82 y.o. female.   Foot Injury    82 year old female with medical history significant for aortic atherosclerosis, hypercalcemia, cirrhosis of the liver, sensorineural hearing loss who presents to the emergency department with bilateral lower extremity swelling.  The patient denies any chest pain or shortness of breath.  She states that can of peas fell on her left foot 3 weeks ago and she has had pain and swelling of the left foot ever since, the can rolled onto her right foot and she has also had swelling to her EXTR.  She has been prescribed Lasix for extremity swelling in the past but has refused to take it.  She last had an echocardiogram in the last 10 months or so and it was normal revealing a normal ejection fraction.  On chart review, the patient underwent echocardiogram in July 2023 with an EF of 60 to 65%, normal diastolic parameters.   Patient denies any redness or swelling.  Home Medications Prior to Admission medications   Medication Sig Start Date End Date Taking? Authorizing Provider  atenolol (TENORMIN) 50 MG tablet Take 1 tablet (50 mg total) by mouth daily. 04/17/22   Fargo, [name] E, NP  Cholecalciferol (VITAMIN D3 PO) Take 10 mg by mouth.    [provider]  furosemide (LASIX) 20 MG tablet TAKE 1 TABLET(20 MG) BY MOUTH DAILY AS NEEDED FOR ANKLE SWELLING 03/30/22   Fargo, [name] E, NP  gabapentin (NEURONTIN) 100 MG capsule Take 1 capsule (100 mg total) by mouth at bedtime. 09/21/22   Fargo, [name] E, NP  lidocaine (LIDODERM) 5 % Place 1 patch onto the skin daily. Remove & Discard patch within 12 hours or as directed by MD 09/23/20   Arthor Captain, PA-C  mupirocin cream (BACTROBAN) 2 % Apply 1 application. topically 2 (two) times daily. 09/20/21   Fargo, [name] E, NP   potassium chloride (KLOR-CON) 10 MEQ tablet TAKE 1 TABLET(10 MEQ) BY MOUTH DAILY WITH FUROSEMIDE AS NEEDED 10/19/22   Fargo, [name] E, NP  sertraline (ZOLOFT) 25 MG tablet Take 1 tablet (25 mg total) by mouth daily. 09/21/22   Fargo, [name] E, NP  traMADol (ULTRAM) 50 MG tablet TAKE 1 TABLET(50 MG) BY MOUTH TWICE DAILY AS NEEDED 09/21/22   Fargo, [name] E, NP  valsartan (DIOVAN) 160 MG tablet TAKE 1 TABLET(160 MG) BY MOUTH DAILY 09/21/22   Octavia Heir, NP      Allergies    Patient has no known allergies.    Review of Systems   Review of Systems  All other systems reviewed and are negative.   Physical Exam Updated Vital Signs BP (!) 148/89 (BP Location: Right Arm)   Pulse 63   Temp 97.9 F (36.6 C) (Oral)   Resp 18   Ht 5\' 2"  (1.575 m)   Wt 66.2 kg   SpO2 94%   BMI 26.70 kg/m  Physical Exam Vitals and nursing note reviewed.  Constitutional:      General: She is not in acute distress.    Appearance: She is well-developed.  HENT:     Head: Normocephalic and atraumatic.  Eyes:     Conjunctiva/sclera: Conjunctivae normal.  Cardiovascular:     Rate and Rhythm: Normal rate and regular rhythm.  Pulmonary:  Effort: Pulmonary effort is normal. No respiratory distress.     Breath sounds: Wheezing present.     Comments: Faint bilateral expiratory wheezing present Abdominal:     Palpations: Abdomen is soft.     Tenderness: There is no abdominal tenderness.  Musculoskeletal:        General: No tenderness.     Cervical back: Neck supple.     Right lower leg: Edema present.     Left lower leg: Edema present.     Comments: Scabbed lesion to the dorsum of the left foot, DP pulses intact bilaterally, 1+ bilateral pitting edema, no tenderness of the medial or lateral malleoli bilaterally, no surrounding erythema of the left foot  Skin:    General: Skin is warm and dry.     Capillary Refill: Capillary refill takes less than 2 seconds.  Neurological:     Mental Status: She is alert.   Psychiatric:        Mood and Affect: Mood normal.     ED Results / Procedures / Treatments   Labs (all labs ordered are listed, but only abnormal results are displayed) Labs Reviewed  CBC WITH DIFFERENTIAL/PLATELET - Abnormal; Notable for the following components:      Result Value   WBC 3.8 (*)    Platelets 99 (*)    All other components within normal limits  BASIC METABOLIC PANEL - Abnormal; Notable for the following components:   Calcium 10.4 (*)    All other components within normal limits  MAGNESIUM  HEPATIC FUNCTION PANEL    EKG None  Radiology US Venous Img Lower Bilateral  Result Date: 10/24/2022 CLINICAL DATA:  Bilateral foot pain and lower extremity edema. Fall 3 weeks ago. EXAM: BILATERAL LOWER EXTREMITY VENOUS DOPPLER ULTRASOUND TECHNIQUE: Gray-scale sonography with graded compression, as well as color Doppler and duplex ultrasound were performed to evaluate the lower extremity deep venous systems from the level of the common femoral vein and including the common femoral, femoral, profunda femoral, popliteal and calf veins including the posterior tibial, peroneal and gastrocnemius veins when visible. The superficial great saphenous vein was also interrogated. Spectral Doppler was utilized to evaluate flow at rest and with distal augmentation maneuvers in the common femoral, femoral and popliteal veins. COMPARISON:  None Available. FINDINGS: RIGHT LOWER EXTREMITY Common Femoral Vein: No evidence of thrombus. Normal compressibility, respiratory phasicity and response to augmentation. Saphenofemoral Junction: No evidence of thrombus. Normal compressibility and flow on color Doppler imaging. Profunda Femoral Vein: No evidence of thrombus. Normal compressibility and flow on color Doppler imaging. Femoral Vein: No evidence of thrombus. Normal compressibility, respiratory phasicity and response to augmentation. Popliteal Vein: No evidence of thrombus. Normal compressibility,  respiratory phasicity and response to augmentation. Calf Veins: No evidence of thrombus. Normal compressibility and flow on color Doppler imaging. Superficial Great Saphenous Vein: No evidence of thrombus. Normal compressibility. Venous Reflux:  None. Other Findings:  None. LEFT LOWER EXTREMITY Common Femoral Vein: No evidence of thrombus. Normal compressibility, respiratory phasicity and response to augmentation. Saphenofemoral Junction: No evidence of thrombus. Normal compressibility and flow on color Doppler imaging. Profunda Femoral Vein: No evidence of thrombus. Normal compressibility and flow on color Doppler imaging. Femoral Vein: No evidence of thrombus. Normal compressibility, respiratory phasicity and response to augmentation. Popliteal Vein: No evidence of thrombus. Normal compressibility, respiratory phasicity and response to augmentation. Calf Veins: No evidence of thrombus. Normal compressibility and flow on color Doppler imaging. Superficial Great Saphenous Vein: No evidence of thrombus. Normal compressibility. Venous  Reflux:  None. Other Findings:  None. IMPRESSION: No evidence of deep venous thrombosis in either lower extremity. Electronically Signed   By: Thornell Sartorius M.D.   On: 10/24/2022 21:06   DG Foot Complete Left  Result Date: 10/24/2022 CLINICAL DATA:  Bilateral foot injury 3 weeks ago. A can fell onto bilateral feet causing pain and swelling. EXAM: LEFT FOOT - COMPLETE 3+ VIEW COMPARISON:  None Available. FINDINGS: There is mild interphalangeal joint space narrowing diffusely. Mild dorsal talonavicular degenerative osteophytosis. Mild third tarsometatarsal joint space narrowing. Mild-to-moderate plantar and posterior calcaneal heel spurs. No acute fracture or dislocation. IMPRESSION: Mild osteoarthritis. No acute fracture. Electronically Signed   By: Neita Garnet M.D.   On: 10/24/2022 18:16   DG Foot Complete Right  Result Date: 10/24/2022 CLINICAL DATA:  Bilateral foot injury 3  weeks ago. A can fell onto bilateral feet causing pain and swelling. EXAM: RIGHT FOOT COMPLETE - 3+ VIEW COMPARISON:  None Available. FINDINGS: There is diffuse decreased bone mineralization. Moderate interphalangeal joint space narrowing diffusely. Mild-to-moderate great toe metatarsophalangeal joint space narrowing. Mild right posterior and small plantar calcaneal heel spurs. Multiple tiny mineralized densities overlying the distal Achilles tendon on lateral view. No acute fracture or dislocation. IMPRESSION: 1. Mild great toe metatarsophalangeal and interphalangeal osteoarthritis. 2. Mild posterior and small plantar calcaneal heel spurs. Electronically Signed   By: Neita Garnet M.D.   On: 10/24/2022 18:14    Procedures Procedures    Medications Ordered in ED Medications - No data to display  ED Course/ Medical Decision Making/ A&P                             Medical Decision Making Amount and/or Complexity of Data Reviewed Labs: ordered. Radiology: ordered.     82 year old female with medical history significant for aortic atherosclerosis, hypercalcemia, cirrhosis of the liver, sensorineural hearing loss who presents to the emergency department with bilateral lower extremity swelling.  The patient denies any chest pain or shortness of breath.  She states that can of peas fell on her left foot 3 weeks ago and she has had pain and swelling of the left foot ever since, the can rolled onto her right foot and she has also had swelling to her EXTR.  She has been prescribed Lasix for extremity swelling in the past but has refused to take it.  She last had an echocardiogram in the last 10 months or so and it was normal revealing a normal ejection fraction.  On chart review, the patient underwent echocardiogram in July 2023 with an EF of 60 to 65%, normal diastolic parameters.   Patient denies any redness or swelling.  Arrival, the patient was vitally stable, afebrile, not tachycardic or tachypneic,  initially hypertensive BP 174/105, saturating 90% on room air.  Faint expiratory wheezing present, trace to 1+ pitting edema bilaterally with no specific tenderness, no erythema or warmth.  Extremities were cool and pulses were intact bilaterally.  The patient is presenting with worsening pain and swelling in the left lower extremity primarily greater than the right.  She attributes this pain to a can falling on her foot however she has chronic swelling in the lower extremities and has not taken Lasix as prescribed.  She has a history of cirrhosis.  She has no history of CHF with normal systolic and diastolic function on echocardiogram 1 year ago.  She denies any chest pain or shortness of breath.  No  cough fever or chills.  My primary concern is for lower extremity edema in the setting of known cirrhosis however injury to the foot is certainly possible causing swelling.  Additionally considered DVT as the patient not on anticoagulation and is endorsing cramping and swelling in the left leg.  Additionally considered electrolyte abnormality.  Offered screening labs in addition to DVT ultrasound to further evaluate.  Labs: CBC with no leukocytosis, magnesium 1.7, no significant electrolyte abnormality on BMP with normal renal function, no anemia, hepatic function panel normal.  DVT US:  IMPRESSION:  No evidence of deep venous thrombosis in either lower extremity.    Encouraged the patient to take Lasix 20 mg as needed for ankle edema, advised PCP follow-up, overall stable for discharge.   Final Clinical Impression(s) / ED Diagnoses Final diagnoses:  Pain in both lower extremities  Symptom of leg swelling    Rx / DC Orders ED Discharge Orders     None         Ernie Avena, MD 10/24/22 2115

## 2022-10-24 NOTE — Discharge Instructions (Addendum)
Follow-up with your PCP, can take 20 mg Lasix as needed for ankle swelling as your labs and ultrasound were reassuring.

## 2022-10-27 ENCOUNTER — Telehealth: Payer: Self-pay | Admitting: *Deleted

## 2022-10-27 NOTE — Telephone Encounter (Signed)
Transition Care Management Unsuccessful Follow-up Telephone Call  Date of discharge and from where:  [facility] 10/24/2022  Attempts:  1st Attempt  Reason for unsuccessful TCM follow-up call:  Left voice message

## 2022-10-30 ENCOUNTER — Other Ambulatory Visit: Payer: Self-pay | Admitting: Orthopedic Surgery

## 2022-10-30 ENCOUNTER — Telehealth: Payer: Self-pay | Admitting: *Deleted

## 2022-10-30 DIAGNOSIS — G8929 Other chronic pain: Secondary | ICD-10-CM

## 2022-10-30 NOTE — Telephone Encounter (Signed)
Transition Care Management Unsuccessful Follow-up Telephone Call  Date of discharge and from where:  [facility] 10/24/2022  Attempts:  2nd Attempt  Reason for unsuccessful TCM follow-up call:  Left voice message

## 2022-10-30 NOTE — Telephone Encounter (Signed)
Pharmacy requested refill.  Epic LR: 09/21/2022 Contract Date: 06/02/2021 No Upcoming appointment.   Pended Rx and sent to [name] for approval.

## 2022-12-06 ENCOUNTER — Ambulatory Visit: Payer: Medicare Other | Admitting: Orthopaedic Surgery

## 2022-12-06 ENCOUNTER — Encounter: Payer: Self-pay | Admitting: Orthopaedic Surgery

## 2022-12-06 DIAGNOSIS — M1711 Unilateral primary osteoarthritis, right knee: Secondary | ICD-10-CM | POA: Diagnosis not present

## 2022-12-06 MED ORDER — HYLAN G-F 20 48 MG/6ML IX SOSY
48.0000 mg | PREFILLED_SYRINGE | INTRA_ARTICULAR | Status: AC | PRN
Start: 2022-12-06 — End: 2022-12-06
  Administered 2022-12-06: 48 mg via INTRA_ARTICULAR

## 2022-12-06 NOTE — Progress Notes (Signed)
Office Visit Note   Patient: Melissa Fletcher           Date of Birth: [birth year]           MRN: [id number] Visit Date: 12/06/2022              Requested by: Octavia Heir, NP [phone] [address] [place],  [place] [address] PCP: Octavia Heir, NP   Assessment & Plan: Visit Diagnoses:  1. Unilateral primary osteoarthritis, right knee     Plan: Impression is right knee osteoarthritis.  Today, proceed with right knee Synvisc injection.  She tolerated this well.  Follow-up as needed. Expiration date 08/19/2024 Lot number and [id number]   Follow-Up Instructions: Return if symptoms worsen or fail to improve.   Orders:  No orders of the defined types were placed in this encounter.  No orders of the defined types were placed in this encounter.     Procedures: Large Joint Inj: R knee on 12/06/2022 9:02 PM Indications: pain Details: 22 G needle  Arthrogram: No  Medications: 48 mg Hylan G-F 20 48 MG/6ML Outcome: tolerated well, no immediate complications Patient was prepped and draped in the usual sterile fashion.       Clinical Data: No additional findings.   Subjective: Chief Complaint  Patient presents with   Right Knee - Follow-up    Synvisc One    HPI patient is a pleasant 82 year old female with underlying right knee osteoarthritis who comes in today for right knee viscosupplementation injection.  She underwent right knee Monovisc injection back in January of this year.     Objective: Vital Signs: There were no vitals taken for this visit.    Ortho Exam stable right knee exam  Specialty Comments:  No specialty comments available.  Imaging: No new imaging   PMFS History: Patient Active Problem List   Diagnosis Date Noted   Right hip pain 02/13/2021   Primary hypertension 02/13/2021   Sensorineural hearing loss (SNHL) of both ears 02/13/2021   Cirrhosis of liver without ascites (HCC) 02/13/2021   History of skin cancer 02/13/2021   Aortic  atherosclerosis (HCC) 02/13/2021   Hypercalcemia 02/13/2021   Thrombocytopenia (HCC) 02/13/2021   Past Medical History:  Diagnosis Date   Arthritis    Hepatitis B    Hypertension     Family History  Problem Relation Age of Onset   Stroke Mother    Cancer Father        Brain   Healthy Daughter    Healthy Daughter    Healthy Son     Past Surgical History:  Procedure Laterality Date   CHOLECYSTECTOMY     TOTAL HIP ARTHROPLASTY Right    Social History   Occupational History   Not on file  Tobacco Use   Smoking status: Former    Current packs/day: 0.00    Average packs/day: 0.5 packs/day for 40.0 years (20.0 ttl pk-yrs)    Types: Cigarettes    Start date: 62    Quit date: 2000    Years since quitting: 24.5   Smokeless tobacco: Never  Vaping Use   Vaping status: Never Used  Substance and Sexual Activity   Alcohol use: Never   Drug use: Never   Sexual activity: Not on file

## 2022-12-16 ENCOUNTER — Other Ambulatory Visit: Payer: Self-pay | Admitting: Orthopedic Surgery

## 2022-12-16 DIAGNOSIS — F419 Anxiety disorder, unspecified: Secondary | ICD-10-CM

## 2022-12-16 DIAGNOSIS — F422 Mixed obsessional thoughts and acts: Secondary | ICD-10-CM

## 2022-12-17 ENCOUNTER — Other Ambulatory Visit: Payer: Self-pay | Admitting: Orthopedic Surgery

## 2022-12-17 DIAGNOSIS — I10 Essential (primary) hypertension: Secondary | ICD-10-CM

## 2022-12-18 ENCOUNTER — Encounter: Payer: Self-pay | Admitting: Adult Health

## 2022-12-18 ENCOUNTER — Ambulatory Visit (INDEPENDENT_AMBULATORY_CARE_PROVIDER_SITE_OTHER): Payer: Medicare Other | Admitting: Adult Health

## 2022-12-18 VITALS — BP 138/82 | HR 60 | Temp 97.2°F | Ht 62.0 in | Wt 152.2 lb

## 2022-12-18 DIAGNOSIS — M25561 Pain in right knee: Secondary | ICD-10-CM

## 2022-12-18 DIAGNOSIS — I10 Essential (primary) hypertension: Secondary | ICD-10-CM

## 2022-12-18 DIAGNOSIS — H6123 Impacted cerumen, bilateral: Secondary | ICD-10-CM | POA: Diagnosis not present

## 2022-12-18 DIAGNOSIS — G8929 Other chronic pain: Secondary | ICD-10-CM

## 2022-12-18 DIAGNOSIS — M25562 Pain in left knee: Secondary | ICD-10-CM | POA: Diagnosis not present

## 2022-12-18 MED ORDER — DEBROX 6.5 % OT SOLN
5.0000 [drp] | Freq: Two times a day (BID) | OTIC | 0 refills | Status: AC
Start: 2022-12-18 — End: 2022-12-22

## 2022-12-18 NOTE — Progress Notes (Unsigned)
[facility]  Provider:   Code Status: *** Goals of Care:     12/18/2022   12:46 PM  Advanced Directives  Does Patient Have a Medical Advance Directive? No  Would patient like information on creating a medical advance directive? No - Patient declined     Chief Complaint  Patient presents with   Acute Visit    Complains of Right Ear feeling full.     HPI: Patient is a 82 y.o. female seen today for an acute visit for  Past Medical History:  Diagnosis Date   Arthritis    Hepatitis B    Hypertension     Past Surgical History:  Procedure Laterality Date   CHOLECYSTECTOMY     TOTAL HIP ARTHROPLASTY Right     No Known Allergies  Outpatient Encounter Medications as of 12/18/2022  Medication Sig   atenolol (TENORMIN) 50 MG tablet Take 1 tablet (50 mg total) by mouth daily. Needs an appointment before anymore future refills.   Cholecalciferol (VITAMIN D3 PO) Take 10 mg by mouth.   furosemide (LASIX) 20 MG tablet TAKE 1 TABLET(20 MG) BY MOUTH DAILY AS NEEDED FOR ANKLE SWELLING   gabapentin (NEURONTIN) 100 MG capsule Take 1 capsule (100 mg total) by mouth at bedtime.   lidocaine (LIDODERM) 5 % Place 1 patch onto the skin daily. Remove & Discard patch within 12 hours or as directed by MD   mupirocin cream (BACTROBAN) 2 % Apply 1 application. topically 2 (two) times daily.   potassium chloride (KLOR-CON) 10 MEQ tablet TAKE 1 TABLET(10 MEQ) BY MOUTH DAILY WITH FUROSEMIDE AS NEEDED   traMADol (ULTRAM) 50 MG tablet TAKE 1 TABLET(50 MG) BY MOUTH TWICE DAILY AS NEEDED   valsartan (DIOVAN) 160 MG tablet TAKE 1 TABLET(160 MG) BY MOUTH DAILY   [DISCONTINUED] sertraline (ZOLOFT) 25 MG tablet Take 1 tablet (25 mg total) by mouth daily.   Facility-Administered Encounter Medications as of 12/18/2022  Medication   diclofenac Sodium (VOLTAREN) 1 % topical gel 2 g    Review of Systems:  Review of Systems  Constitutional:  Negative for appetite change, chills, fatigue and fever.  HENT:   Positive for ear pain and hearing loss. Negative for congestion, rhinorrhea and sore throat.        Right ear hurting X 4 days, tinnitus  Eyes: Negative.   Respiratory:  Negative for cough, shortness of breath and wheezing.   Cardiovascular:  Negative for chest pain, palpitations and leg swelling.  Gastrointestinal:  Negative for abdominal pain, constipation, diarrhea, nausea and vomiting.  Genitourinary:  Negative for dysuria.  Musculoskeletal:  Negative for arthralgias, back pain and myalgias.  Skin:  Negative for color change, rash and wound.  Neurological:  Negative for dizziness, weakness and headaches.  Psychiatric/Behavioral:  Negative for behavioral problems. The patient is not nervous/anxious.     Health Maintenance  Topic Date Due   DTaP/Tdap/Td (1 - Tdap) Never done   Zoster Vaccines- Shingrix (1 of 2) Never done   COVID-19 Vaccine (6 - 2023-24 season) 01/20/2022   Medicare Annual Wellness (AWV)  09/09/2022   INFLUENZA VACCINE  12/21/2022   Pneumonia Vaccine 37+ Years old  Completed   DEXA SCAN  Completed   HPV VACCINES  Aged Out    Physical Exam: Vitals:   12/18/22 1111  BP: 138/82  Pulse: 60  Temp: (!) 97.2 F (36.2 C)  SpO2: 92%  Weight: 152 lb 3.2 oz (69 kg)  Height: 5\' 2"  (1.575 m)  Body mass index is 27.84 kg/m. Physical Exam Constitutional:      Appearance: Normal appearance.  HENT:     Head: Normocephalic and atraumatic.     Right Ear: There is impacted cerumen.     Left Ear: There is impacted cerumen.     Nose: Nose normal.     Mouth/Throat:     Mouth: Mucous membranes are moist.  Eyes:     Conjunctiva/sclera: Conjunctivae normal.  Cardiovascular:     Rate and Rhythm: Normal rate and regular rhythm.  Pulmonary:     Effort: Pulmonary effort is normal.     Breath sounds: Normal breath sounds.  Abdominal:     General: Bowel sounds are normal.     Palpations: Abdomen is soft.  Musculoskeletal:        General: Normal range of motion.      Cervical back: Normal range of motion.  Skin:    General: Skin is warm and dry.  Neurological:     General: No focal deficit present.     Mental Status: She is alert and oriented to person, place, and time.  Psychiatric:        Mood and Affect: Mood normal.        Behavior: Behavior normal.        Thought Content: Thought content normal.        Judgment: Judgment normal.     Labs reviewed: Basic Metabolic Panel: Recent Labs    04/06/22 1440 09/21/22 0948 10/24/22 2002  NA 139 140 140  K 4.1 4.0 3.9  CL 103 105 105  CO2 28 29 30   GLUCOSE 80 91 85  BUN 14 15 13   CREATININE 0.73 0.74 0.63  CALCIUM 10.0 10.3 10.4*  MG  --   --  1.7   Liver Function Tests: Recent Labs    04/06/22 1440 09/21/22 0948 10/24/22 2002  AST 26 29 33  ALT 11 16 15   ALKPHOS  --   --  99  BILITOT 0.7 0.7 0.6  PROT 7.4 7.7 7.7  ALBUMIN  --   --  3.8   No results for input(s): "LIPASE", "AMYLASE" in the last 8760 hours. No results for input(s): "AMMONIA" in the last 8760 hours. CBC: Recent Labs    04/06/22 1440 09/21/22 0948 10/24/22 2002  WBC 3.3* 3.9 3.8*  NEUTROABS 1,865 2,391 1.9  HGB 11.5* 12.2 12.4  HCT 34.2* 37.0 37.6  MCV 93.4 93.7 96.2  PLT 96* 94* 99*   Lipid Panel: No results for input(s): "CHOL", "HDL", "LDLCALC", "TRIG", "CHOLHDL", "LDLDIRECT" in the last 8760 hours. No results found for: "HGBA1C"  Procedures since last visit: No results found.  Assessment/Plan .   Labs/tests ordered:  None  Next appt:  Visit date not found

## 2022-12-27 ENCOUNTER — Other Ambulatory Visit: Payer: Self-pay | Admitting: Orthopedic Surgery

## 2022-12-27 DIAGNOSIS — G629 Polyneuropathy, unspecified: Secondary | ICD-10-CM

## 2022-12-27 NOTE — Telephone Encounter (Signed)
Requested medication is not on active medication list. Patient will need to schedule a video or in person visit to discuss

## 2022-12-28 ENCOUNTER — Ambulatory Visit (INDEPENDENT_AMBULATORY_CARE_PROVIDER_SITE_OTHER): Payer: Medicare Other | Admitting: Orthopedic Surgery

## 2022-12-28 ENCOUNTER — Encounter: Payer: Self-pay | Admitting: Orthopedic Surgery

## 2022-12-28 VITALS — BP 100/68 | HR 61 | Temp 97.7°F | Resp 16 | Ht 62.0 in | Wt 155.2 lb

## 2022-12-28 DIAGNOSIS — H6123 Impacted cerumen, bilateral: Secondary | ICD-10-CM | POA: Diagnosis not present

## 2022-12-28 NOTE — Progress Notes (Signed)
Careteam: Patient Care Team: Melissa Heir, NP as PCP - General (Adult Health Nurse Practitioner)  Seen by: [name], AGNP-C  PLACE OF SERVICE:  [facility]  Advanced Directive information Does Patient Have a Medical Advance Directive?: Yes, Type of Advance Directive: Living will;Out of facility DNR (pink MOST or yellow form), Does patient want to make changes to medical advance directive?: No - Patient declined  No Known Allergies  Chief Complaint  Patient presents with   Follow-up    Ear lavage.     HPI: Patient is a 82 y.o. female seen today for acute visit due to cerumen impaction.   Daughter present during encounter.   H/o cerumen buildup. She also uses hearing aids. She has used Debrox gtts at bedtime for last 5 days. Presents for ear lavage today. Afebrile. Vitals stable.    Review of Systems:  Review of Systems  Constitutional: Negative.   HENT:  Negative for ear pain and tinnitus.   Respiratory:  Negative for cough, shortness of breath and wheezing.   Cardiovascular:  Negative for chest pain and leg swelling.  Skin: Negative.   Psychiatric/Behavioral:  Positive for memory loss. Negative for depression.     Past Medical History:  Diagnosis Date   Arthritis    Hepatitis B    Hypertension    Past Surgical History:  Procedure Laterality Date   CHOLECYSTECTOMY     TOTAL HIP ARTHROPLASTY Right    Social History:   reports that she quit smoking about 24 years ago. Her smoking use included cigarettes. She started smoking about 64 years ago. She has a 20 pack-year smoking history. She has never used smokeless tobacco. She reports that she does not drink alcohol and does not use drugs.  Family History  Problem Relation Age of Onset   Stroke Mother    Cancer Father        Brain   Healthy Daughter    Healthy Daughter    Healthy Son     Medications: Patient's Medications  New Prescriptions   No medications on file  Previous Medications   ATENOLOL  (TENORMIN) 50 MG TABLET    Take 1 tablet (50 mg total) by mouth daily. Needs an appointment before anymore future refills.   CHOLECALCIFEROL (VITAMIN D3 PO)    Take 10 mg by mouth.   FUROSEMIDE (LASIX) 20 MG TABLET    TAKE 1 TABLET(20 MG) BY MOUTH DAILY AS NEEDED FOR ANKLE SWELLING   POTASSIUM CHLORIDE (KLOR-CON) 10 MEQ TABLET    TAKE 1 TABLET(10 MEQ) BY MOUTH DAILY WITH FUROSEMIDE AS NEEDED   TRAMADOL (ULTRAM) 50 MG TABLET    TAKE 1 TABLET(50 MG) BY MOUTH TWICE DAILY AS NEEDED   VALSARTAN (DIOVAN) 160 MG TABLET    TAKE 1 TABLET(160 MG) BY MOUTH DAILY  Modified Medications   No medications on file  Discontinued Medications   No medications on file    Physical Exam:  Vitals:   12/28/22 1410  BP: 100/68  Pulse: 61  Resp: 16  Temp: 97.7 F (36.5 C)  SpO2: (!) 87%  Weight: 155 lb 3.2 oz (70.4 kg)  Height: 5\' 2"  (1.575 m)   Body mass index is 28.39 kg/m. Wt Readings from Last 3 Encounters:  12/28/22 155 lb 3.2 oz (70.4 kg)  12/18/22 152 lb 3.2 oz (69 kg)  10/24/22 146 lb (66.2 kg)    Physical Exam Vitals reviewed.  Constitutional:      General: She is not in  acute distress. HENT:     Head: Normocephalic.     Right Ear: There is impacted cerumen.     Left Ear: There is impacted cerumen.  Cardiovascular:     Rate and Rhythm: Normal rate and regular rhythm.     Pulses: Normal pulses.     Heart sounds: Normal heart sounds.  Pulmonary:     Effort: Pulmonary effort is normal.     Breath sounds: Normal breath sounds.  Neurological:     General: No focal deficit present.     Mental Status: She is alert.  Psychiatric:        Mood and Affect: Mood normal.     Labs reviewed: Basic Metabolic Panel: Recent Labs    04/06/22 1440 09/21/22 0948 10/24/22 2002  NA 139 140 140  K 4.1 4.0 3.9  CL 103 105 105  CO2 28 29 30   GLUCOSE 80 91 85  BUN 14 15 13   CREATININE 0.73 0.74 0.63  CALCIUM 10.0 10.3 10.4*  MG  --   --  1.7   Liver Function Tests: Recent Labs     04/06/22 1440 09/21/22 0948 10/24/22 2002  AST 26 29 33  ALT 11 16 15   ALKPHOS  --   --  99  BILITOT 0.7 0.7 0.6  PROT 7.4 7.7 7.7  ALBUMIN  --   --  3.8   No results for input(s): "LIPASE", "AMYLASE" in the last 8760 hours. No results for input(s): "AMMONIA" in the last 8760 hours. CBC: Recent Labs    04/06/22 1440 09/21/22 0948 10/24/22 2002  WBC 3.3* 3.9 3.8*  NEUTROABS 1,865 2,391 1.9  HGB 11.5* 12.2 12.4  HCT 34.2* 37.0 37.6  MCV 93.4 93.7 96.2  PLT 96* 94* 99*   Lipid Panel: No results for input(s): "CHOL", "HDL", "LDLCALC", "TRIG", "CHOLHDL", "LDLDIRECT" in the last 8760 hours. TSH: No results for input(s): "TSH" in the last 8760 hours. A1C: No results found for: "HGBA1C"   Assessment/Plan 1. Bilateral impacted cerumen - ear lavage successful> 2 large pieces of wax removed - cont Debrox gtts monthly x 5 days at night  - Ear Lavage  Total time: 11 minutes. Greater than 50% of total time spent doing patient education regarding cerumen impaction including symptom/medication management.     Next appt: Visit date not found  Soua Lenk Scherry Ran  [facility] [phone]

## 2023-01-17 ENCOUNTER — Other Ambulatory Visit: Payer: Self-pay | Admitting: Orthopedic Surgery

## 2023-01-17 DIAGNOSIS — F419 Anxiety disorder, unspecified: Secondary | ICD-10-CM

## 2023-01-17 DIAGNOSIS — G629 Polyneuropathy, unspecified: Secondary | ICD-10-CM

## 2023-01-17 DIAGNOSIS — F422 Mixed obsessional thoughts and acts: Secondary | ICD-10-CM

## 2023-01-17 IMAGING — CR DG KNEE COMPLETE 4+V*R*
4 series · 4 of 4 positions shown · non-contrast
Comparison: None.

CLINICAL DATA: Right knee pain.

EXAM:
RIGHT KNEE - COMPLETE 4+ VIEW

[w knee ap right]
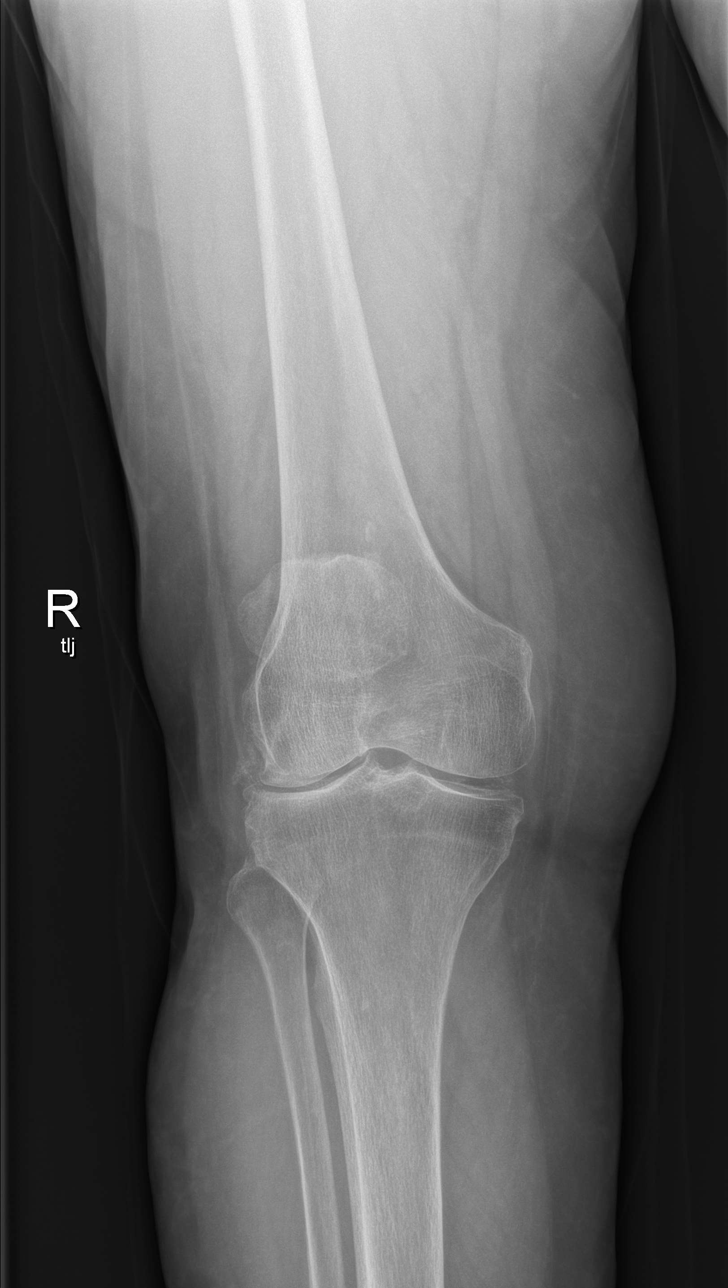

[w knee lat right]
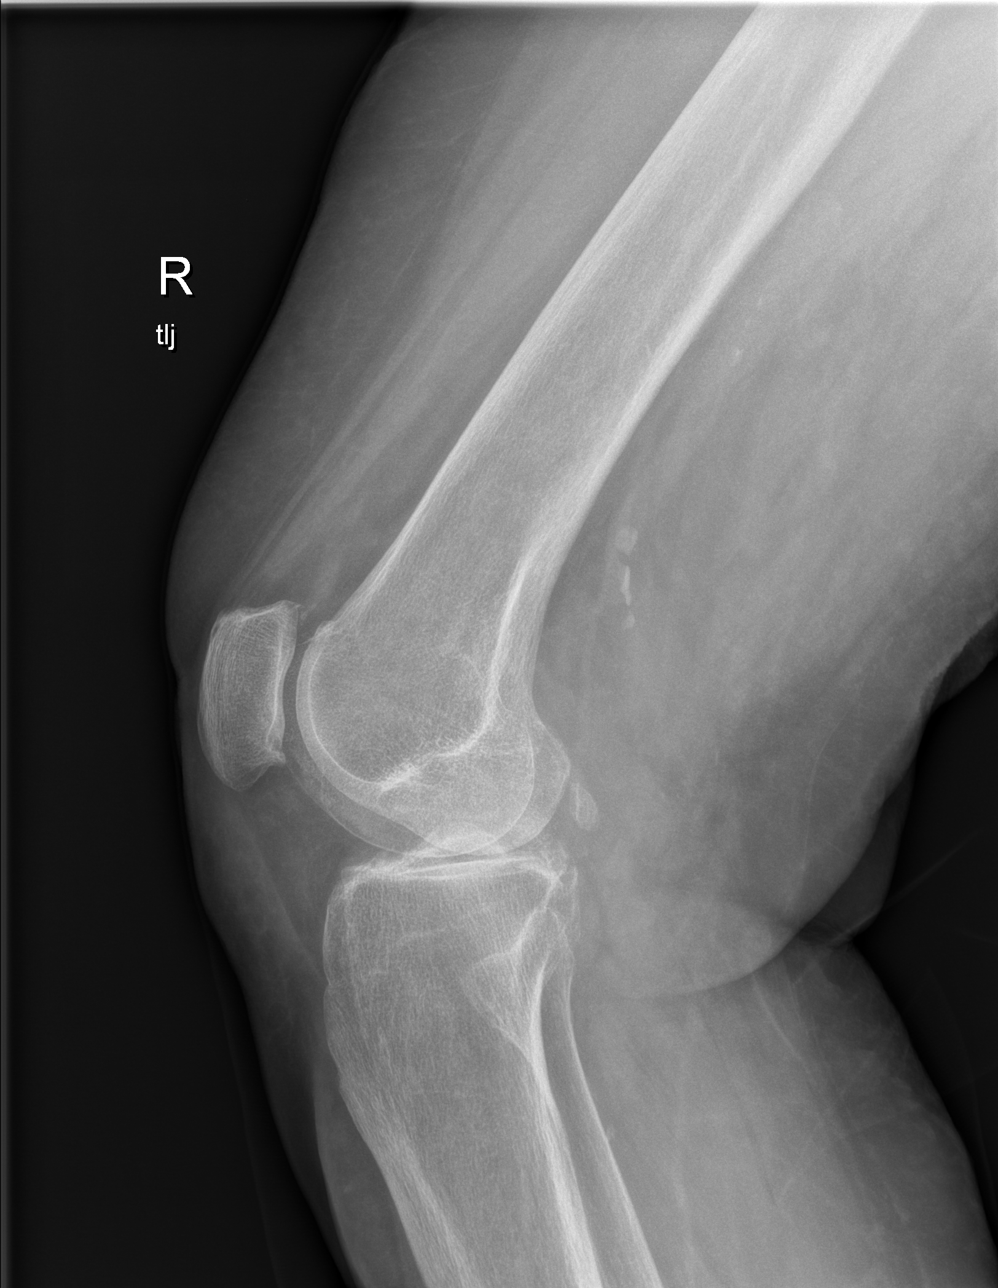

[w knee tunnel pa right]
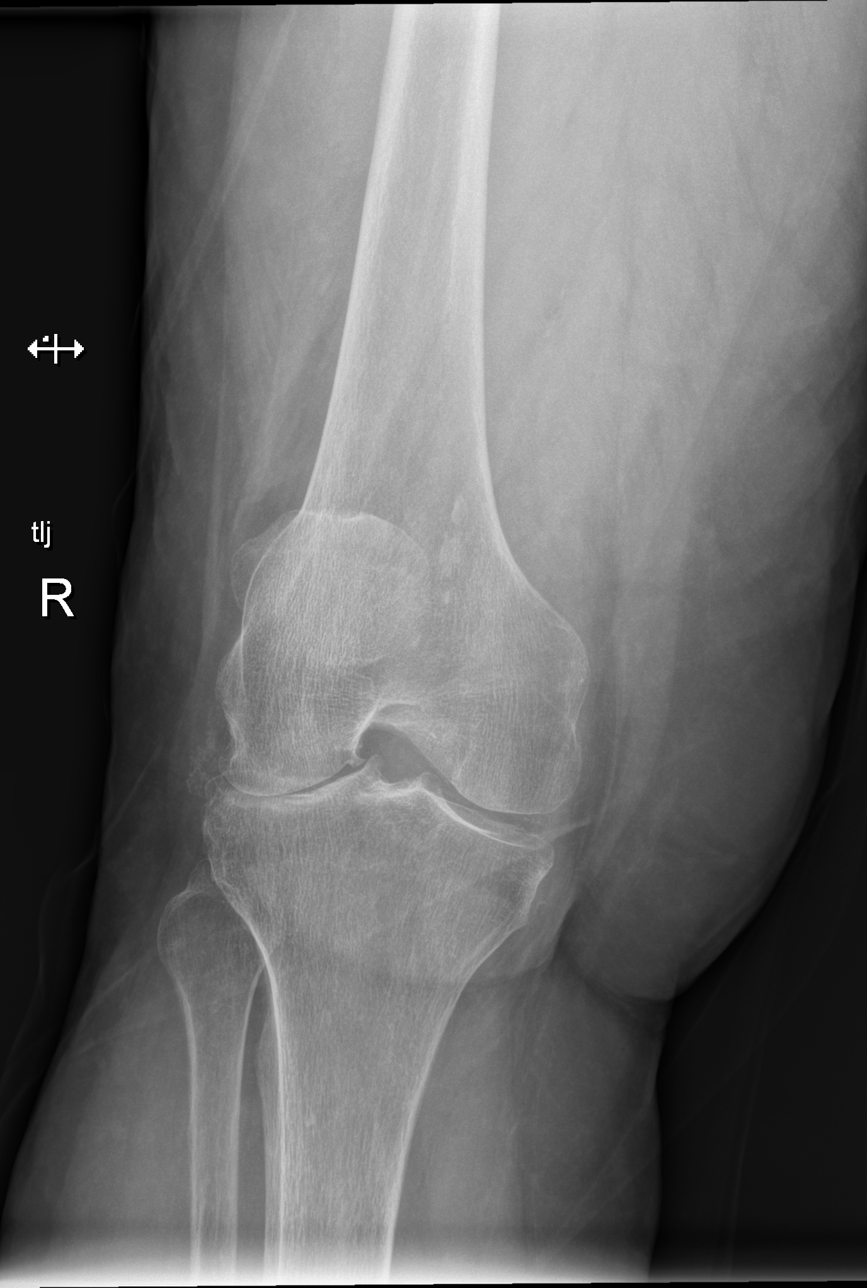

[x knee sunrise right]
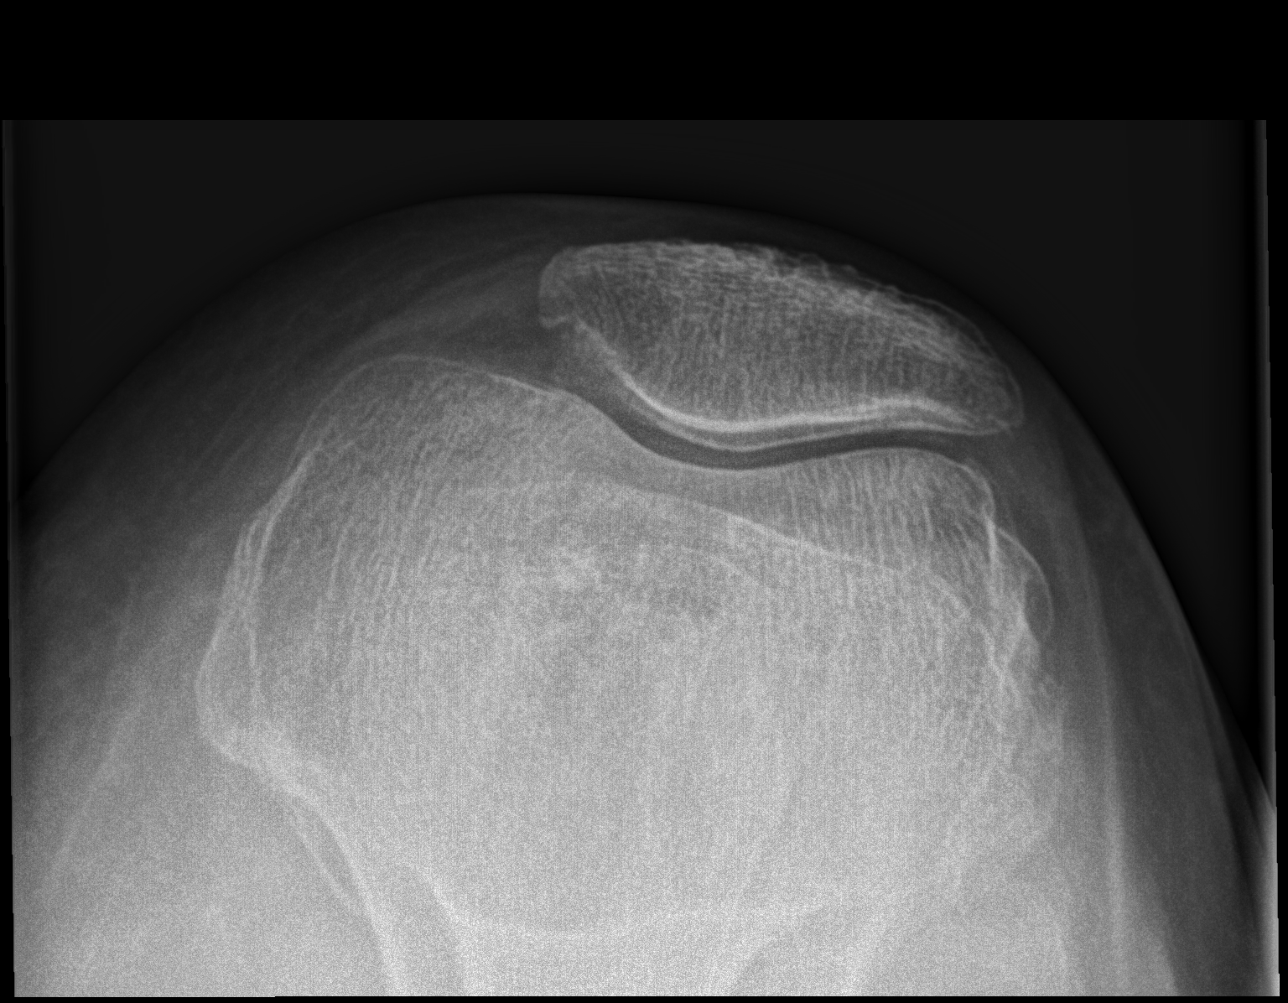

[4 of 4 positions shown; findings below may reference images not displayed]

FINDINGS: There is no acute fracture or dislocation. The bones are osteopenic.
There is arthritic changes of the right knee with tricompartmental
narrowing and severe narrowing of the lateral compartment. No joint
effusion. The soft tissues are unremarkable.
IMPRESSION: 1. No acute fracture or dislocation.
2. Osteoarthritis.

## 2023-01-17 IMAGING — CR DG HIP (WITH OR WITHOUT PELVIS) 2-3V*R*
2 series · 2 of 2 positions shown · non-contrast
Comparison: None.

CLINICAL DATA: Right hip pain.

EXAM:
DG HIP (WITH OR WITHOUT PELVIS) 2-3V RIGHT

[w hip ap right]
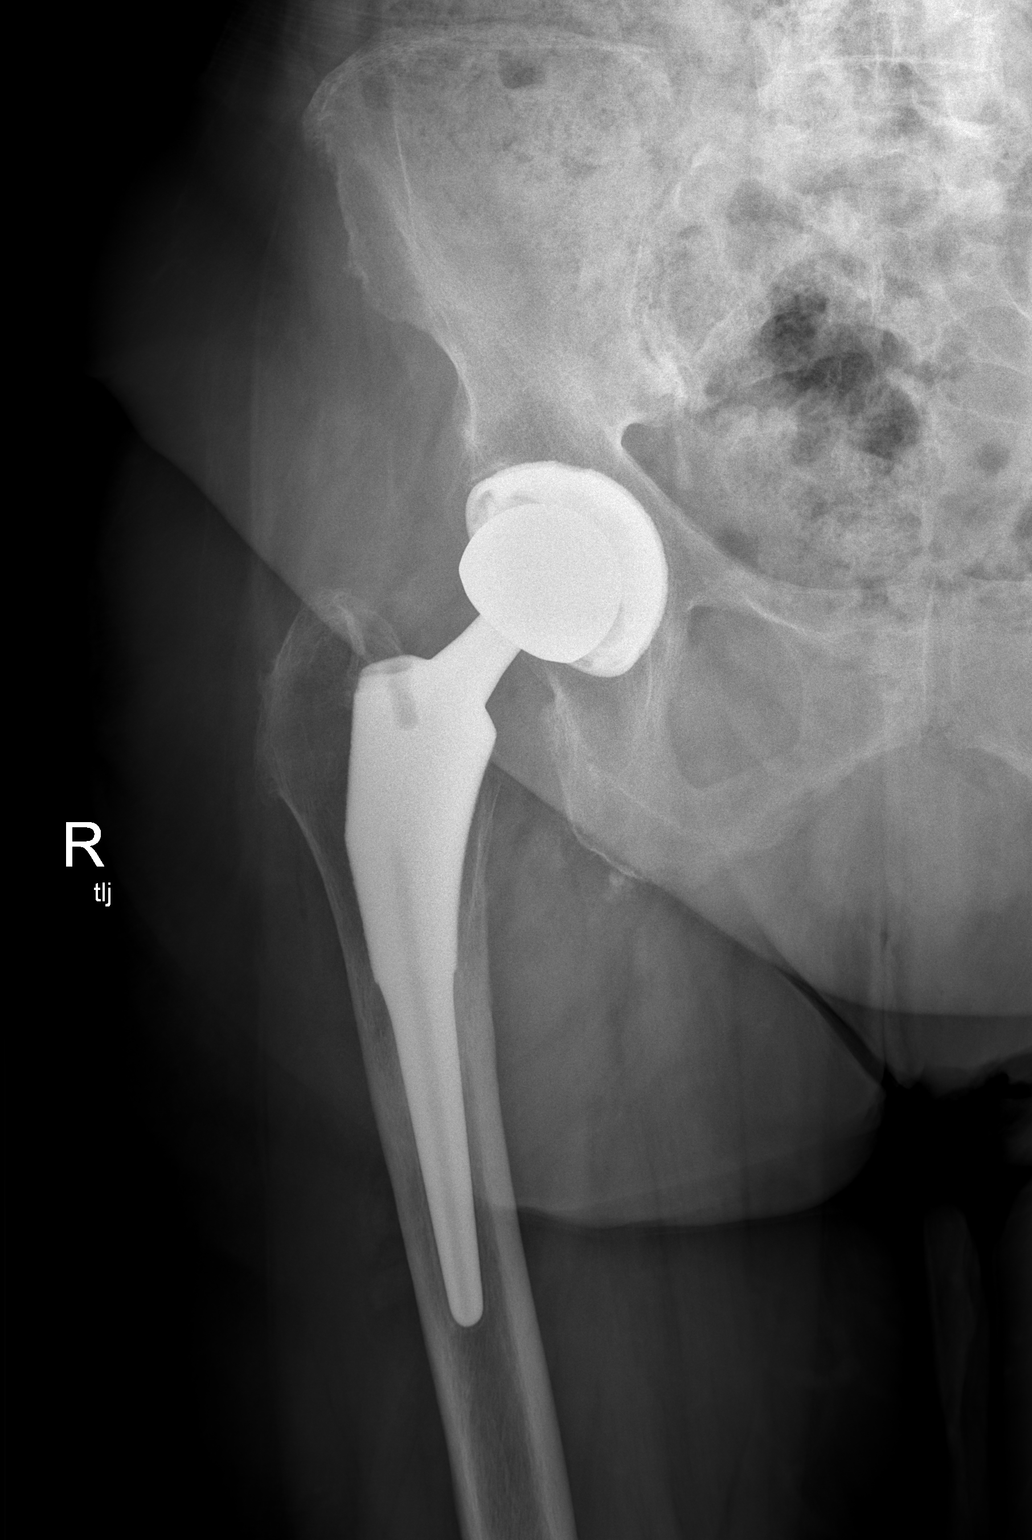

[w hip frog right]
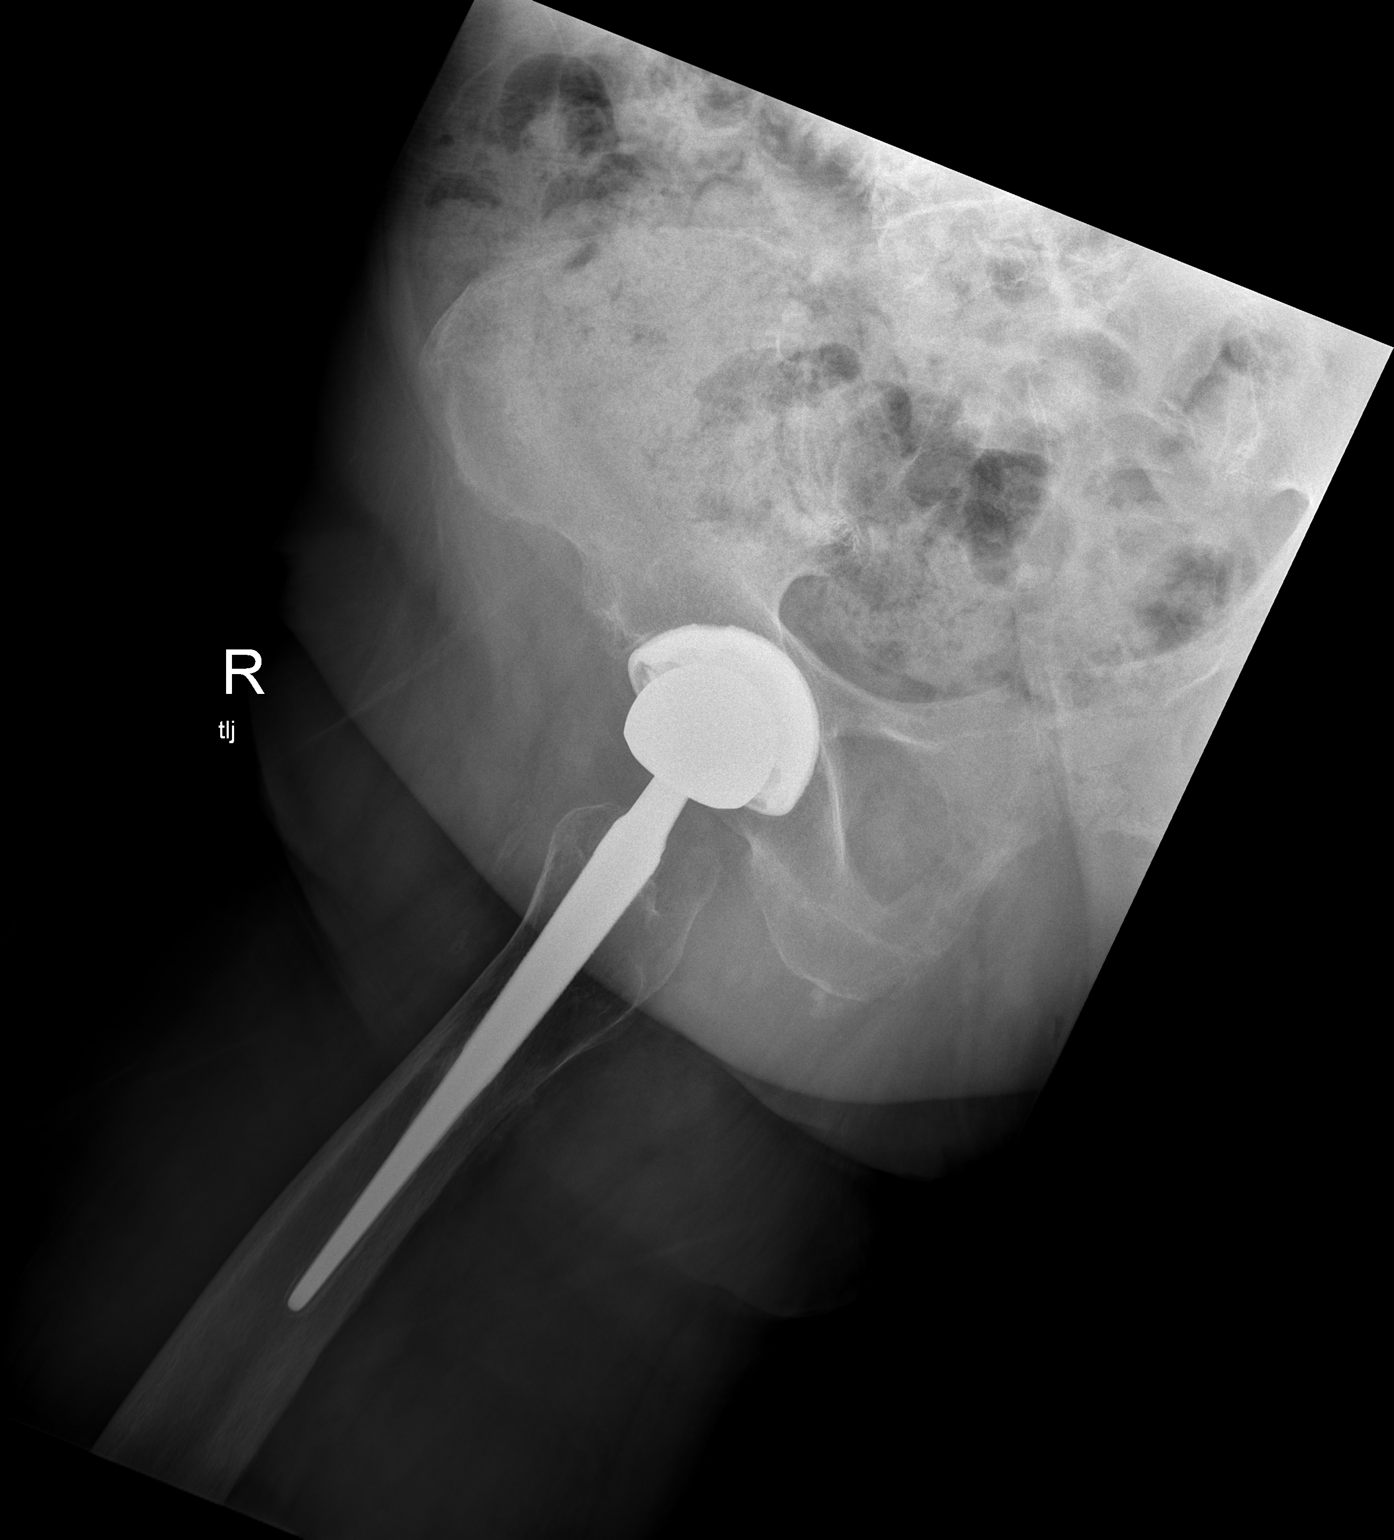

[2 of 2 positions shown; findings below may reference images not displayed]

FINDINGS: There is a total right hip arthroplasty. The arthroplasty components
appear intact and in anatomic alignment. There is no acute fracture
or dislocation. The bones are osteopenic. The soft tissues are
unremarkable.
IMPRESSION: No acute fracture or dislocation.

## 2023-02-26 ENCOUNTER — Other Ambulatory Visit: Payer: Self-pay | Admitting: Orthopedic Surgery

## 2023-02-26 DIAGNOSIS — M25473 Effusion, unspecified ankle: Secondary | ICD-10-CM

## 2023-03-08 ENCOUNTER — Ambulatory Visit: Payer: Medicare Other | Admitting: Orthopaedic Surgery

## 2023-03-09 ENCOUNTER — Ambulatory Visit: Payer: Medicare Other | Admitting: Orthopaedic Surgery

## 2023-03-13 ENCOUNTER — Ambulatory Visit (INDEPENDENT_AMBULATORY_CARE_PROVIDER_SITE_OTHER): Payer: Medicare Other | Admitting: Orthopaedic Surgery

## 2023-03-13 ENCOUNTER — Encounter: Payer: Self-pay | Admitting: Orthopaedic Surgery

## 2023-03-13 ENCOUNTER — Telehealth: Payer: Self-pay | Admitting: Orthopaedic Surgery

## 2023-03-13 DIAGNOSIS — M1711 Unilateral primary osteoarthritis, right knee: Secondary | ICD-10-CM

## 2023-03-13 MED ORDER — BUPIVACAINE HCL 0.5 % IJ SOLN
2.0000 mL | INTRAMUSCULAR | Status: AC | PRN
Start: 2023-03-13 — End: 2023-03-13
  Administered 2023-03-13: 2 mL via INTRA_ARTICULAR

## 2023-03-13 MED ORDER — METHYLPREDNISOLONE ACETATE 40 MG/ML IJ SUSP
40.0000 mg | INTRAMUSCULAR | Status: AC | PRN
Start: 2023-03-13 — End: 2023-03-13
  Administered 2023-03-13: 40 mg via INTRA_ARTICULAR

## 2023-03-13 MED ORDER — LIDOCAINE HCL 1 % IJ SOLN
2.0000 mL | INTRAMUSCULAR | Status: AC | PRN
Start: 2023-03-13 — End: 2023-03-13
  Administered 2023-03-13: 2 mL

## 2023-03-13 NOTE — Telephone Encounter (Signed)
Pt would like Gel injections for after 06/13/2023 please advise

## 2023-03-13 NOTE — Progress Notes (Signed)
Office Visit Note   Patient: Melissa Fletcher           Date of Birth: 06/13/1940           MRN: [id number] Visit Date: 03/13/2023              Requested by: Octavia Heir, NP [phone] [address] [place],  [place] [address] PCP: Octavia Heir, NP   Assessment & Plan: Visit Diagnoses:  1. Primary osteoarthritis of right knee     Plan: 82 year old female with right knee osteoarthritis.  Cortisone injection performed.  She tolerated this well.  Will see her back in another 3 months for Visco injection.  Follow-Up Instructions: No follow-ups on file.   Orders:  No orders of the defined types were placed in this encounter.  No orders of the defined types were placed in this encounter.     Procedures: Large Joint Inj: R knee on 03/13/2023 8:24 AM Indications: pain Details: 22 G needle  Arthrogram: No  Medications: 40 mg methylPREDNISolone acetate 40 MG/ML; 2 mL lidocaine 1 %; 2 mL bupivacaine 0.5 % Consent was given by the patient. Patient was prepped and draped in the usual sterile fashion.       Clinical Data: No additional findings.   Subjective: Chief Complaint  Patient presents with   Right Knee - Pain    HPI Melissa Fletcher is a 82 year old female who returns today for right knee osteoarthritis.  She is here for cortisone injection. Review of Systems   Objective: Vital Signs: There were no vitals taken for this visit.  Physical Exam  Ortho Exam Exam of the right knee is unchanged. Specialty Comments:  No specialty comments available.  Imaging: No results found.   PMFS History: Patient Active Problem List   Diagnosis Date Noted   Right hip pain 02/13/2021   Primary hypertension 02/13/2021   Sensorineural hearing loss (SNHL) of both ears 02/13/2021   Cirrhosis of liver without ascites (HCC) 02/13/2021   History of skin cancer 02/13/2021   Aortic atherosclerosis (HCC) 02/13/2021   Hypercalcemia 02/13/2021   Thrombocytopenia (HCC) 02/13/2021   Past  Medical History:  Diagnosis Date   Arthritis    Hepatitis B    Hypertension     Family History  Problem Relation Age of Onset   Stroke Mother    Cancer Father        Brain   Healthy Daughter    Healthy Daughter    Healthy Son     Past Surgical History:  Procedure Laterality Date   CHOLECYSTECTOMY     TOTAL HIP ARTHROPLASTY Right    Social History   Occupational History   Not on file  Tobacco Use   Smoking status: Former    Current packs/day: 0.00    Average packs/day: 0.5 packs/day for 40.0 years (20.0 ttl pk-yrs)    Types: Cigarettes    Start date: 82    Quit date: 2000    Years since quitting: 24.8   Smokeless tobacco: Never  Vaping Use   Vaping status: Never Used  Substance and Sexual Activity   Alcohol use: Never   Drug use: Never   Sexual activity: Not on file

## 2023-03-14 ENCOUNTER — Ambulatory Visit: Payer: Medicare Other | Admitting: Orthopaedic Surgery

## 2023-03-15 ENCOUNTER — Other Ambulatory Visit: Payer: Self-pay | Admitting: Orthopedic Surgery

## 2023-03-15 DIAGNOSIS — I10 Essential (primary) hypertension: Secondary | ICD-10-CM

## 2023-03-15 NOTE — Telephone Encounter (Signed)
Noted. Will submit after 05/26/2023 for gel injection

## 2023-03-19 ENCOUNTER — Other Ambulatory Visit: Payer: Self-pay | Admitting: Orthopedic Surgery

## 2023-03-19 DIAGNOSIS — I10 Essential (primary) hypertension: Secondary | ICD-10-CM

## 2023-03-26 ENCOUNTER — Other Ambulatory Visit: Payer: Self-pay | Admitting: Orthopedic Surgery

## 2023-03-26 DIAGNOSIS — M25473 Effusion, unspecified ankle: Secondary | ICD-10-CM

## 2023-04-12 ENCOUNTER — Other Ambulatory Visit: Payer: Self-pay | Admitting: Orthopedic Surgery

## 2023-04-12 DIAGNOSIS — M25473 Effusion, unspecified ankle: Secondary | ICD-10-CM

## 2023-04-23 ENCOUNTER — Other Ambulatory Visit: Payer: Self-pay | Admitting: Orthopedic Surgery

## 2023-04-23 DIAGNOSIS — M25473 Effusion, unspecified ankle: Secondary | ICD-10-CM

## 2023-05-13 ENCOUNTER — Other Ambulatory Visit: Payer: Self-pay | Admitting: Orthopedic Surgery

## 2023-05-13 DIAGNOSIS — G8929 Other chronic pain: Secondary | ICD-10-CM

## 2023-05-14 NOTE — Telephone Encounter (Signed)
Patient has request refill on medication Tramadol. No contract on file.Patient medication pend and sent to PCP Octavia Heir, NP for approval.

## 2023-05-21 ENCOUNTER — Other Ambulatory Visit: Payer: Self-pay | Admitting: Orthopedic Surgery

## 2023-05-21 DIAGNOSIS — M25473 Effusion, unspecified ankle: Secondary | ICD-10-CM

## 2023-05-22 ENCOUNTER — Ambulatory Visit (INDEPENDENT_AMBULATORY_CARE_PROVIDER_SITE_OTHER): Payer: Medicare Other | Admitting: Family

## 2023-05-22 ENCOUNTER — Encounter: Payer: Self-pay | Admitting: Family

## 2023-05-22 VITALS — BP 104/66 | HR 60 | Temp 97.6°F | Resp 17 | Ht 62.0 in | Wt 149.0 lb

## 2023-05-22 DIAGNOSIS — I10 Essential (primary) hypertension: Secondary | ICD-10-CM | POA: Diagnosis not present

## 2023-05-22 DIAGNOSIS — R300 Dysuria: Secondary | ICD-10-CM | POA: Diagnosis not present

## 2023-05-22 DIAGNOSIS — N939 Abnormal uterine and vaginal bleeding, unspecified: Secondary | ICD-10-CM

## 2023-05-22 LAB — CBC WITH DIFFERENTIAL/PLATELET
Absolute Lymphocytes: 714 {cells}/uL — ABNORMAL LOW (ref 850–3900)
Absolute Monocytes: 325 {cells}/uL (ref 200–950)
Basophils Absolute: 62 {cells}/uL (ref 0–200)
Basophils Relative: 2.2 %
Eosinophils Absolute: 204 {cells}/uL (ref 15–500)
Eosinophils Relative: 7.3 %
HCT: 33.7 % — ABNORMAL LOW (ref 35.0–45.0)
Hemoglobin: 11 g/dL — ABNORMAL LOW (ref 11.7–15.5)
MCH: 31.2 pg (ref 27.0–33.0)
MCHC: 32.6 g/dL (ref 32.0–36.0)
MCV: 95.5 fL (ref 80.0–100.0)
MPV: 10.8 fL (ref 7.5–12.5)
Monocytes Relative: 11.6 %
Neutro Abs: 1495 {cells}/uL — ABNORMAL LOW (ref 1500–7800)
Neutrophils Relative %: 53.4 %
Platelets: 101 10*3/uL — ABNORMAL LOW (ref 140–400)
RBC: 3.53 10*6/uL — ABNORMAL LOW (ref 3.80–5.10)
RDW: 12 % (ref 11.0–15.0)
Total Lymphocyte: 25.5 %
WBC: 2.8 10*3/uL — ABNORMAL LOW (ref 3.8–10.8)

## 2023-05-22 LAB — POCT URINALYSIS DIPSTICK
Bilirubin, UA: NEGATIVE
Glucose, UA: NEGATIVE
Ketones, UA: NEGATIVE
Nitrite, UA: NEGATIVE
Protein, UA: NEGATIVE
Spec Grav, UA: 1.02 (ref 1.010–1.025)
Urobilinogen, UA: 0.2 U/dL
pH, UA: 6 (ref 5.0–8.0)

## 2023-05-22 NOTE — Progress Notes (Signed)
 Provider: Shawnae Leiva FNP-C  Gil Greig BRAVO, NP  Patient Care Team: Gil Greig BRAVO, NP as PCP - General (Adult Health Nurse Practitioner)  Extended Emergency Contact Information Primary Emergency Contact: Shultz,[name] Mobile Phone: [phone] Relation: Daughter Secondary Emergency Contact: Cooper,Sheryl Mobile Phone: [phone] Relation: Daughter  Code Status:  Full Code  Goals of care: Advanced Directive information    05/22/2023    9:13 AM  Advanced Directives  Does Patient Have a Medical Advance Directive? Yes  Type of Advance Directive Living will  Does patient want to make changes to medical advance directive? No - Patient declined     Chief Complaint  Patient presents with   Acute Visit    Patient complains of vaginal bleeding that started 05/16/2023.    HPI:  Pt is a 82 y.o. female seen today for an acute visit for evaluation of vaginal bleeding since after christmas x 6 days.started bleeding the afternoon after christmas.describes bleeding as spotting when she wipes on the tissues.states also burns on her vaginal area and toward the anal area when she urinates.states has been pushing wipes up on her vagina to absorb the bleeding. States run out of the pads. States sitting worsen the symptoms.Has been standing with legs apart to help relief the symptoms. She denies any abdominal pain,bloating,nausea,vomiting or back pain.  denies any fever,chills,flank pain,urgency,frequency,dysuria,difficult urination or hematuria. Patient's HPI limited due to cognitive impairment.daughter states sometimes says one thing then later contradicts herself.     Past Medical History:  Diagnosis Date   Arthritis    Hepatitis B    Hypertension    Past Surgical History:  Procedure Laterality Date   CHOLECYSTECTOMY     TOTAL HIP ARTHROPLASTY Right     No Known Allergies  Outpatient Encounter Medications as of 05/22/2023  Medication Sig   atenolol  (TENORMIN ) 50 MG tablet TAKE  1 TABLET(50 MG) BY MOUTH DAILY   Cholecalciferol (VITAMIN D3 PO) Take 10 mg by mouth.   furosemide  (LASIX ) 20 MG tablet TAKE 1 TABLET(20 MG) BY MOUTH DAILY AS NEEDED FOR ANKLE SWELLING   potassium chloride  (KLOR-CON ) 10 MEQ tablet TAKE 1 TABLET(10 MEQ) BY MOUTH DAILY WITH FUROSEMIDE  AS NEEDED   traMADol  (ULTRAM ) 50 MG tablet TAKE 1 TABLET(50 MG) BY MOUTH TWICE DAILY AS NEEDED   valsartan  (DIOVAN ) 160 MG tablet TAKE 1 TABLET(160 MG) BY MOUTH DAILY   Facility-Administered Encounter Medications as of 05/22/2023  Medication   diclofenac  Sodium (VOLTAREN ) 1 % topical gel 2 g    Review of Systems  Constitutional:  Negative for appetite change, chills, fatigue, fever and unexpected weight change.  Eyes:  Negative for pain, discharge, redness, itching and visual disturbance.  Respiratory:  Negative for cough, chest tightness, shortness of breath and wheezing.   Cardiovascular:  Negative for chest pain, palpitations and leg swelling.  Gastrointestinal:  Negative for abdominal distention, abdominal pain, blood in stool, constipation, diarrhea, nausea and vomiting.  Genitourinary:  Positive for dysuria and vaginal bleeding. Negative for difficulty urinating, flank pain, frequency, pelvic pain, urgency, vaginal discharge and vaginal pain.  Musculoskeletal:  Negative for arthralgias, back pain, gait problem, joint swelling, myalgias, neck pain and neck stiffness.  Skin:  Negative for color change, pallor, rash and wound.  Neurological:  Negative for dizziness, weakness, light-headedness, numbness and headaches.  Hematological:  Does not bruise/bleed easily.  Psychiatric/Behavioral:  Negative for agitation, behavioral problems, confusion and hallucinations. The patient is not nervous/anxious.     Immunization History  Administered Date(s) Administered  Fluad Quad(high Dose 65+) 01/20/2021   Influenza, High Dose Seasonal PF 03/15/2023   Moderna Covid-19 Vaccine Bivalent Booster 31yrs & up 03/15/2023    PFIZER(Purple Top)SARS-COV-2 Vaccination 09/11/2019, 10/06/2019   PNEUMOCOCCAL CONJUGATE-20 07/31/2021   Pfizer Covid-19 Vaccine Bivalent Booster 79yrs & up 04/16/2020, 11/07/2020, 07/31/2021   Pertinent  Health Maintenance Due  Topic Date Due   INFLUENZA VACCINE  Completed   DEXA SCAN  Completed      04/06/2022    1:51 PM 09/21/2022    9:17 AM 12/18/2022   12:46 PM 12/28/2022    2:13 PM 05/22/2023    9:10 AM  Fall Risk  Falls in the past year? 0 0 0 0 0  Was there an injury with Fall? 0 0 0 0 0  Fall Risk Category Calculator  0 0 0 0  (RETIRED) Patient Fall Risk Level Low fall risk      Patient at Risk for Falls Due to No Fall Risks No Fall Risks  No Fall Risks   Fall risk Follow up Falls evaluation completed Falls evaluation completed  Falls evaluation completed;Education provided;Falls prevention discussed    Functional Status Survey:    Vitals:   05/22/23 0915  BP: 104/66  Pulse: 60  Resp: 17  Temp: 97.6 F (36.4 C)  SpO2: 95%  Weight: 149 lb (67.6 kg)  Height: 5' 2 (1.575 m)   Body mass index is 27.25 kg/m. Physical Exam Vitals reviewed. Exam conducted with a chaperone present (Porscha M.CMA).  Constitutional:      General: She is not in acute distress.    Appearance: Normal appearance. She is overweight. She is not ill-appearing or diaphoretic.  HENT:     Head: Normocephalic.     Mouth/Throat:     Mouth: Mucous membranes are moist.     Pharynx: Oropharynx is clear. No oropharyngeal exudate or posterior oropharyngeal erythema.  Eyes:     General: No scleral icterus.       Right eye: No discharge.        Left eye: No discharge.     Conjunctiva/sclera: Conjunctivae normal.     Pupils: Pupils are equal, round, and reactive to light.  Neck:     Vascular: No carotid bruit.  Cardiovascular:     Rate and Rhythm: Normal rate and regular rhythm.     Pulses: Normal pulses.     Heart sounds: Normal heart sounds. No murmur heard.    No friction rub. No gallop.   Pulmonary:     Effort: Pulmonary effort is normal. No respiratory distress.     Breath sounds: Normal breath sounds. No wheezing, rhonchi or rales.  Chest:     Chest wall: No tenderness.  Abdominal:     General: Bowel sounds are normal. There is no distension.     Palpations: Abdomen is soft. There is no mass.     Tenderness: There is no abdominal tenderness. There is no right CVA tenderness, left CVA tenderness, guarding or rebound.     Hernia: There is no hernia in the left inguinal area or right inguinal area.  Genitourinary:    Exam position: Lithotomy position.     Labia:        Right: No rash, tenderness or lesion.        Left: No rash, tenderness or lesion.      Urethra: No prolapse, urethral pain, urethral swelling or urethral lesion.     Vagina: No vaginal discharge, erythema, tenderness, bleeding or lesions.  Adnexa: Right adnexa normal and left adnexa normal.  Musculoskeletal:        General: No swelling or tenderness. Normal range of motion.     Cervical back: Normal range of motion. No rigidity or tenderness.     Right lower leg: No edema.     Left lower leg: No edema.  Lymphadenopathy:     Cervical: No cervical adenopathy.     Lower Body: No right inguinal adenopathy. No left inguinal adenopathy.  Skin:    General: Skin is warm and dry.     Coloration: Skin is not pale.     Findings: No bruising, erythema, lesion or rash.  Neurological:     Mental Status: She is alert and oriented to person, place, and time.     Cranial Nerves: No cranial nerve deficit.     Sensory: No sensory deficit.     Motor: No weakness.     Coordination: Coordination normal.     Gait: Gait abnormal.  Psychiatric:        Mood and Affect: Mood normal.        Speech: Speech normal.        Behavior: Behavior normal.        Cognition and Memory: She exhibits impaired recent memory.     Labs reviewed: Recent Labs    09/21/22 0948 10/24/22 2002  NA 140 140  K 4.0 3.9  CL 105 105   CO2 29 30  GLUCOSE 91 85  BUN 15 13  CREATININE 0.74 0.63  CALCIUM  10.3 10.4*  MG  --  1.7   Recent Labs    09/21/22 0948 10/24/22 2002  AST 29 33  ALT 16 15  ALKPHOS  --  99  BILITOT 0.7 0.6  PROT 7.7 7.7  ALBUMIN  --  3.8   Recent Labs    09/21/22 0948 10/24/22 2002  WBC 3.9 3.8*  NEUTROABS 2,391 1.9  HGB 12.2 12.4  HCT 37.0 37.6  MCV 93.7 96.2  PLT 94* 99*   No results found for: TSH No results found for: HGBA1C No results found for: CHOL, HDL, LDLCALC, LDLDIRECT, TRIG, CHOLHDL  Significant Diagnostic Results in last 30 days:  No results found.  Assessment/Plan 1. Vaginal bleeding (Primary) No bleeding noted on exam.not on anticoagulant   Chaprone Porscha M.CMA and patient's daughter present throughout the exam  - POC Urinalysis Dipstick indicates moderate blood and 1+ leukocytes but negative for nitrites.  Will send urine for culture Also discussed referral to gynecologist for further evaluation of reported bleeding  - Ambulatory referral to Gynecology - Urine Culture  2. Dysuria Afebrile  Negative exam findings. - Encouraged to increase fluid intake  - Urine Culture  3. Primary hypertension blood pressure well-controlled - CBC with Differential/Platelet  Family/ staff Communication: Reviewed plan of care with patient and daughter verbalized understanding  Labs/tests ordered:  - POC Urinalysis Dipstick  - CBC with Differential/Platelet - Urine Culture  Next Appointment: Return if symptoms worsen or fail to improve.   Roxan JAYSON Plough, NP

## 2023-05-22 NOTE — Patient Instructions (Addendum)
-   Avoid inserting wipes in the vaginal area use pads or underwear liner instead and change promptly if wet  - Notify provider if pads are saturated with blood or using more than 3 pads per day  - also notify provider if running any fever or chills

## 2023-05-23 LAB — URINE CULTURE
MICRO NUMBER:: 15906978
Result:: NO GROWTH
SPECIMEN QUALITY:: ADEQUATE

## 2023-05-24 ENCOUNTER — Other Ambulatory Visit: Payer: Self-pay | Admitting: Family

## 2023-05-24 DIAGNOSIS — D649 Anemia, unspecified: Secondary | ICD-10-CM

## 2023-05-24 DIAGNOSIS — D696 Thrombocytopenia, unspecified: Secondary | ICD-10-CM

## 2023-05-24 NOTE — Progress Notes (Signed)
Hematology referral ordered.

## 2023-05-31 ENCOUNTER — Encounter: Payer: Self-pay | Admitting: Orthopedic Surgery

## 2023-06-05 ENCOUNTER — Telehealth: Payer: Self-pay

## 2023-06-05 ENCOUNTER — Other Ambulatory Visit: Payer: Self-pay | Admitting: Orthopedic Surgery

## 2023-06-05 DIAGNOSIS — I10 Essential (primary) hypertension: Secondary | ICD-10-CM

## 2023-06-05 NOTE — Telephone Encounter (Signed)
 Patients daughter [name] called to say that she has not heard about oncology referral. [name] is aware that I will send message to our referral coordinator to follow-up.   Thanks,  S.Chrae B/CMA

## 2023-06-08 ENCOUNTER — Ambulatory Visit (INDEPENDENT_AMBULATORY_CARE_PROVIDER_SITE_OTHER): Payer: Medicare Other | Admitting: Orthopaedic Surgery

## 2023-06-08 ENCOUNTER — Encounter: Payer: Self-pay | Admitting: Orthopaedic Surgery

## 2023-06-08 ENCOUNTER — Telehealth: Payer: Self-pay

## 2023-06-08 DIAGNOSIS — M1711 Unilateral primary osteoarthritis, right knee: Secondary | ICD-10-CM | POA: Diagnosis not present

## 2023-06-08 MED ORDER — BUPIVACAINE HCL 0.5 % IJ SOLN
2.0000 mL | INTRAMUSCULAR | Status: AC | PRN
  Administered 2023-06-08: 2 mL via INTRA_ARTICULAR

## 2023-06-08 MED ORDER — METHYLPREDNISOLONE ACETATE 40 MG/ML IJ SUSP
40.0000 mg | INTRAMUSCULAR | Status: AC | PRN
Start: 2023-06-08 — End: 2023-06-08
  Administered 2023-06-08: 40 mg via INTRA_ARTICULAR

## 2023-06-08 MED ORDER — LIDOCAINE HCL 1 % IJ SOLN
2.0000 mL | INTRAMUSCULAR | Status: AC | PRN
Start: 2023-06-08 — End: 2023-06-08
  Administered 2023-06-08: 2 mL

## 2023-06-08 NOTE — Telephone Encounter (Signed)
VOB submitted for Monovisc, right knee  

## 2023-06-08 NOTE — Progress Notes (Signed)
Office Visit Note   Patient: Melissa Fletcher           Date of Birth: [birth year]           MRN: [id number] Visit Date: 06/08/2023              Requested by: Octavia Heir, NP [phone] [place],  [place] [address] PCP: Octavia Heir, NP   Assessment & Plan: Visit Diagnoses:  1. Primary osteoarthritis of right knee     Plan: Impression is right knee osteoarthritis.  Although the patient is currently due for repeat viscosupplementation injection, she is still awaiting approval.  We have elected to proceed with cortisone injection today.  She will follow-up in 3 months for gel injection.  Call with concerns or questions in the meantime.  Follow-Up Instructions: Return if symptoms worsen or fail to improve.   Orders:  Orders Placed This Encounter  Procedures   Large Joint Inj   No orders of the defined types were placed in this encounter.     Procedures: Large Joint Inj: R knee on 06/08/2023 11:55 AM Indications: pain Details: 22 G needle  Arthrogram: No  Medications: 40 mg methylPREDNISolone acetate 40 MG/ML; 2 mL lidocaine 1 %; 2 mL bupivacaine 0.5 % Consent was given by the patient. Patient was prepped and draped in the usual sterile fashion.       Clinical Data: No additional findings.   Subjective: Chief Complaint  Patient presents with   Right Knee - Pain    HPI patient is a pleasant 83 year old female who comes in today with recurrent right knee pain.  History of osteoarthritis.  The pain she has is constant.  She has been taking tramadol as needed.  She has been seeing [place] every 3 months where she has been alternating viscosupplementation as well as cortisone injections to the right knee.  She underwent her last gel injection in July 2024 and her last cortisone injection in October 2024.  We are currently awaiting approval for repeat viscosupplementation injection.  Review of Systems as detailed in HPI.  All others reviewed and are  negative.   Objective: Vital Signs: LMP 07/27/1992   Physical Exam well-developed well-nourished female in no acute distress.  Alert and oriented x 3.  Ortho Exam right knee exam unchanged.  Specialty Comments:  No specialty comments available.  Imaging: No new imaging   PMFS History: Patient Active Problem List   Diagnosis Date Noted   Right hip pain 02/13/2021   Primary hypertension 02/13/2021   Sensorineural hearing loss (SNHL) of both ears 02/13/2021   Cirrhosis of liver without ascites (HCC) 02/13/2021   History of skin cancer 02/13/2021   Aortic atherosclerosis (HCC) 02/13/2021   Hypercalcemia 02/13/2021   Thrombocytopenia (HCC) 02/13/2021   Past Medical History:  Diagnosis Date   Arthritis    Hepatitis B    Hypertension     Family History  Problem Relation Age of Onset   Stroke Mother    Cancer Father        Brain   Healthy Daughter    Healthy Daughter    Healthy Son     Past Surgical History:  Procedure Laterality Date   CHOLECYSTECTOMY     TOTAL HIP ARTHROPLASTY Right    Social History   Occupational History   Not on file  Tobacco Use   Smoking status: Former    Current packs/day: 0.00    Average packs/day: 0.5 packs/day for 40.0 years (  20.0 ttl pk-yrs)    Types: Cigarettes    Start date: 36    Quit date: 2000    Years since quitting: 25.0   Smokeless tobacco: Never  Vaping Use   Vaping status: Never Used  Substance and Sexual Activity   Alcohol use: Never   Drug use: Never   Sexual activity: Not on file

## 2023-06-18 ENCOUNTER — Other Ambulatory Visit: Payer: Self-pay | Admitting: Orthopedic Surgery

## 2023-06-18 DIAGNOSIS — M25473 Effusion, unspecified ankle: Secondary | ICD-10-CM

## 2023-06-27 ENCOUNTER — Inpatient Hospital Stay: Payer: Medicare Other | Attending: Internal Medicine | Admitting: Internal Medicine

## 2023-06-27 ENCOUNTER — Inpatient Hospital Stay: Payer: Medicare Other

## 2023-06-27 VITALS — BP 143/55 | HR 56 | Temp 97.6°F | Resp 15 | Ht 62.0 in | Wt 148.1 lb

## 2023-06-27 DIAGNOSIS — I10 Essential (primary) hypertension: Secondary | ICD-10-CM | POA: Insufficient documentation

## 2023-06-27 DIAGNOSIS — Z79899 Other long term (current) drug therapy: Secondary | ICD-10-CM | POA: Diagnosis not present

## 2023-06-27 DIAGNOSIS — K7030 Alcoholic cirrhosis of liver without ascites: Secondary | ICD-10-CM | POA: Diagnosis not present

## 2023-06-27 DIAGNOSIS — D539 Nutritional anemia, unspecified: Secondary | ICD-10-CM | POA: Diagnosis not present

## 2023-06-27 DIAGNOSIS — B1911 Unspecified viral hepatitis B with hepatic coma: Secondary | ICD-10-CM

## 2023-06-27 DIAGNOSIS — B1910 Unspecified viral hepatitis B without hepatic coma: Secondary | ICD-10-CM | POA: Insufficient documentation

## 2023-06-27 DIAGNOSIS — N939 Abnormal uterine and vaginal bleeding, unspecified: Secondary | ICD-10-CM | POA: Diagnosis not present

## 2023-06-27 DIAGNOSIS — M1990 Unspecified osteoarthritis, unspecified site: Secondary | ICD-10-CM | POA: Diagnosis not present

## 2023-06-27 DIAGNOSIS — D61818 Other pancytopenia: Secondary | ICD-10-CM | POA: Insufficient documentation

## 2023-06-27 LAB — CMP (CANCER CENTER ONLY)
ALT: 13 U/L (ref 0–44)
AST: 29 U/L (ref 15–41)
Albumin: 3.7 g/dL (ref 3.5–5.0)
Alkaline Phosphatase: 90 U/L (ref 38–126)
Anion gap: 2 — ABNORMAL LOW (ref 5–15)
BUN: 18 mg/dL (ref 8–23)
CO2: 32 mmol/L (ref 22–32)
Calcium: 10.5 mg/dL — ABNORMAL HIGH (ref 8.9–10.3)
Chloride: 104 mmol/L (ref 98–111)
Creatinine: 0.77 mg/dL (ref 0.44–1.00)
GFR, Estimated: >60 mL/min (ref >60)
Glucose, Bld: 86 mg/dL (ref 70–99)
Potassium: 4.1 mmol/L (ref 3.5–5.1)
Sodium: 138 mmol/L (ref 135–145)
Total Bilirubin: 0.8 mg/dL (ref 0.0–1.2)
Total Protein: 7.6 g/dL (ref 6.5–8.1)

## 2023-06-27 LAB — CBC WITH DIFFERENTIAL (CANCER CENTER ONLY)
Abs Immature Granulocytes: 0.01 10*3/uL (ref 0.00–0.07)
Basophils Absolute: 0 10*3/uL (ref 0.0–0.1)
Basophils Relative: 1 %
Eosinophils Absolute: 0.3 10*3/uL (ref 0.0–0.5)
Eosinophils Relative: 7 %
HCT: 34.2 % — ABNORMAL LOW (ref 36.0–46.0)
Hemoglobin: 11.3 g/dL — ABNORMAL LOW (ref 12.0–15.0)
Immature Granulocytes: 0 %
Lymphocytes Relative: 29 %
Lymphs Abs: 1 10*3/uL (ref 0.7–4.0)
MCH: 31.9 pg (ref 26.0–34.0)
MCHC: 33 g/dL (ref 30.0–36.0)
MCV: 96.6 fL (ref 80.0–100.0)
Monocytes Absolute: 0.4 10*3/uL (ref 0.1–1.0)
Monocytes Relative: 12 %
Neutro Abs: 1.8 10*3/uL (ref 1.7–7.7)
Neutrophils Relative %: 51 %
Platelet Count: 76 10*3/uL — ABNORMAL LOW (ref 150–400)
RBC: 3.54 MIL/uL — ABNORMAL LOW (ref 3.87–5.11)
RDW: 12.6 % (ref 11.5–15.5)
Smear Review: NORMAL
WBC Count: 3.6 10*3/uL — ABNORMAL LOW (ref 4.0–10.5)
nRBC: 0 % (ref 0.0–0.2)

## 2023-06-27 LAB — HEPATITIS PANEL, ACUTE
HCV Ab: NONREACTIVE
Hep A IgM: NONREACTIVE
Hep B C IgM: NONREACTIVE
Hepatitis B Surface Ag: NONREACTIVE

## 2023-06-27 LAB — IRON AND IRON BINDING CAPACITY (CC-WL,HP ONLY)
Iron: 112 ug/dL (ref 28–170)
Saturation Ratios: 36 % — ABNORMAL HIGH (ref 10.4–31.8)
TIBC: 309 ug/dL (ref 250–450)
UIBC: 197 ug/dL (ref 148–442)

## 2023-06-27 LAB — TSH: TSH: 0.906 u[IU]/mL (ref 0.350–4.500)

## 2023-06-27 LAB — VITAMIN B12: Vitamin B-12: 306 pg/mL (ref 180–914)

## 2023-06-27 LAB — FERRITIN: Ferritin: 285 ng/mL (ref 11–307)

## 2023-06-27 LAB — FOLATE: Folate: 25.4 ng/mL (ref >5.9)

## 2023-06-27 NOTE — Progress Notes (Signed)
 [facility] [phone]   [phone]  CONSULT NOTE  REFERRING PHYSICIAN: Greig Cluster, NP  REASON FOR CONSULTATION:  83 years old white female with pancytopenia  HPI Melissa Fletcher is a 83 y.o. female.   Discussed the use of AI scribe software for clinical note transcription with the patient, who gave verbal consent to proceed.  History of Present Illness   Melissa Fletcher is an 83 year old female who presents with low blood counts and recent vaginal bleeding. She is accompanied by her daughter, Greig Saras. She was referred by her primary care physician for evaluation of low blood counts and recent vaginal bleeding.  She has experienced consistently low blood counts, including white blood cells, red blood cells, and platelets, over the past few years. A recent complete blood count (CBC) on February 07, 2021, showed a white blood cell count of 3.7 and platelets at 101,000.  Approximately one month ago, she experienced vaginal bleeding, described as 'just drops of blood,' which has since resolved. She is uncertain if the bleeding originated from the vagina or was related to hemorrhoids. There have been no other episodes of bleeding. Repeat CBC on May 22, 2023 showed total white blood count of 2.8 with hemoglobin 11.0 hematocrit 33.7 and platelets count 101,000  No symptoms commonly associated with anemia such as fatigue, weakness, dizziness, or craving for ice. No epistaxis, gum bleeding, or blood in stool. She does not bruise easily and has not experienced chest pain, shortness of breath, or changes in vision.  Her current medication regimen includes vitamin C, and she does not take iron or multivitamins.      HPI  Past Medical History:  Diagnosis Date   Arthritis    Hepatitis B    Hypertension     Past Surgical History:  Procedure Laterality Date   CHOLECYSTECTOMY     TOTAL HIP ARTHROPLASTY Right     Family History  Problem  Relation Age of Onset   Stroke Mother    Cancer Father        Brain   Healthy Daughter    Healthy Daughter    Healthy Son     Social History Social History   Tobacco Use   Smoking status: Former    Current packs/day: 0.00    Average packs/day: 0.5 packs/day for 40.0 years (20.0 ttl pk-yrs)    Types: Cigarettes    Start date: 50    Quit date: 2000    Years since quitting: 25.1   Smokeless tobacco: Never  Vaping Use   Vaping status: Never Used  Substance Use Topics   Alcohol use: Never   Drug use: Never    No Known Allergies  Current Outpatient Medications  Medication Sig Dispense Refill   atenolol  (TENORMIN ) 50 MG tablet TAKE 1 TABLET(50 MG) BY MOUTH DAILY 90 tablet 0   Cholecalciferol (VITAMIN D3 PO) Take 10 mg by mouth.     furosemide  (LASIX ) 20 MG tablet TAKE 1 TABLET(20 MG) BY MOUTH DAILY AS NEEDED FOR ANKLE SWELLING 30 tablet 0   potassium chloride  (KLOR-CON ) 10 MEQ tablet TAKE 1 TABLET(10 MEQ) BY MOUTH DAILY WITH FUROSEMIDE  AS NEEDED 90 tablet 1   traMADol  (ULTRAM ) 50 MG tablet TAKE 1 TABLET(50 MG) BY MOUTH TWICE DAILY AS NEEDED 60 tablet 1   valsartan  (DIOVAN ) 160 MG tablet TAKE 1 TABLET(160 MG) BY MOUTH DAILY 90 tablet 1   Current Facility-Administered Medications  Medication Dose Route Frequency Provider Last  Rate Last Admin   diclofenac  Sodium (VOLTAREN ) 1 % topical gel 2 g  2 g Topical Daily PRN Fargo, [name] E, NP        Review of Systems  Constitutional: positive for fatigue Eyes: negative Ears, nose, mouth, throat, and face: negative Respiratory: negative Cardiovascular: negative Gastrointestinal: negative Genitourinary:negative Integument/breast: negative Hematologic/lymphatic: negative Musculoskeletal:positive for arthralgias Neurological: negative Behavioral/Psych: negative Endocrine: negative Allergic/Immunologic: negative  Physical Exam  MJO:jozmu, healthy, no distress, well nourished, and well developed SKIN: skin color, texture,  turgor are normal, no rashes or significant lesions HEAD: Normocephalic, No masses, lesions, tenderness or abnormalities EYES: normal, PERRLA, Conjunctiva are pink and non-injected EARS: External ears normal, Canals clear OROPHARYNX:no exudate, no erythema, and lips, buccal mucosa, and tongue normal  NECK: supple, no adenopathy, no JVD LYMPH:  no palpable lymphadenopathy, no hepatosplenomegaly BREAST:not examined LUNGS: clear to auscultation , and palpation HEART: regular rate & rhythm, no murmurs, and no gallops ABDOMEN:abdomen soft, non-tender, normal bowel sounds, and no masses or organomegaly BACK: Back symmetric, no curvature., No CVA tenderness EXTREMITIES:no joint deformities, effusion, or inflammation, no edema  NEURO: alert & oriented x 3 with fluent speech, no focal motor/sensory deficits  PERFORMANCE STATUS: ECOG 1  LABORATORY DATA: Lab Results  Component Value Date   WBC 2.8 (L) 05/22/2023   HGB 11.0 (L) 05/22/2023   HCT 33.7 (L) 05/22/2023   MCV 95.5 05/22/2023   PLT 101 (L) 05/22/2023      Chemistry      Component Value Date/Time   NA 140 10/24/2022 2002   K 3.9 10/24/2022 2002   CL 105 10/24/2022 2002   CO2 30 10/24/2022 2002   BUN 13 10/24/2022 2002   CREATININE 0.63 10/24/2022 2002   CREATININE 0.74 09/21/2022 0948      Component Value Date/Time   CALCIUM  10.4 (H) 10/24/2022 2002   ALKPHOS 99 10/24/2022 2002   AST 33 10/24/2022 2002   ALT 15 10/24/2022 2002   BILITOT 0.6 10/24/2022 2002       RADIOGRAPHIC STUDIES: No results found.  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 Repeat CBC on May 22, 2023 showed total white blood count of 2.8 with hemoglobin 11.0 hematocrit 33.7 and platelets count 101,000 No symptoms of fatigue, weakness, dizziness, epistaxis, gum bleeding, melena, or easy bruising. Differential diagnosis  includes bone marrow disorder, nutritional deficiencies, or sequelae of past hepatitis B. Further blood work needed to identify potential causes. - Order repeat CBC - Order iron studies, Ferritin, Vitamin B12, Serum Folate, ANA, RF, Hepatitis Panel, HIV test. - Follow up in one month - Contact patient with lab results and adjust treatment as necessary  Vaginal Bleeding Intermittent vaginal bleeding, likely minor and self-limited. Possible local irritation or minor trauma. Advised monitoring for recurrence or increase in bleeding and to report any new or worsening symptoms. - Monitor for recurrence or increase in bleeding - Advise patient to report any new or worsening symptoms  Hypertension Hypertension, currently well-managed. No symptoms of chest pain, shortness of breath, or headaches. - Continue current management - Monitor blood pressure regularly  Arthritis Arthritis, currently well-managed. No complaints of joint pain or stiffness. - Continue current management - Advise on regular physical activity as tolerated  Hepatitis B (History) History of hepatitis B with past liver damage. No current symptoms or active liver disease. - Monitor liver function tests periodically  General Health  Maintenance General health maintenance discussed. No issues with smoking or alcohol use. No known drug allergies. - Encourage balanced diet and regular exercise - Advise on routine health screenings appropriate for age  Follow-up - Follow up in one month - Contact patient with lab results and adjust treatment as necessary.       The patient voices understanding of current disease status and treatment options and is in agreement with the current care plan.  All questions were answered. The patient knows to call the clinic with any problems, questions or concerns. We can certainly see the patient much sooner if necessary.  Thank you so much for allowing me to participate in the care of [facility]. I will continue to follow up the patient with you and assist in her care.  The total time spent in the appointment was 60 minutes.  Disclaimer: This note was dictated with voice recognition software. Similar sounding words can inadvertently be transcribed and may not be corrected upon review.   Sherrod MARLA Sherrod June 27, 2023, 12:10 PM

## 2023-06-30 LAB — RHEUMATOID FACTOR: Rheumatoid fact SerPl-aCnc: <10 [IU]/mL (ref <14.0)

## 2023-07-02 LAB — ANTINUCLEAR ANTIBODIES, IFA: ANA Ab, IFA: NEGATIVE

## 2023-07-17 ENCOUNTER — Other Ambulatory Visit: Payer: Self-pay | Admitting: Orthopedic Surgery

## 2023-07-17 DIAGNOSIS — M25473 Effusion, unspecified ankle: Secondary | ICD-10-CM

## 2023-07-25 ENCOUNTER — Inpatient Hospital Stay: Payer: Medicare Other | Attending: Internal Medicine | Admitting: Internal Medicine

## 2023-07-25 ENCOUNTER — Inpatient Hospital Stay (HOSPITAL_BASED_OUTPATIENT_CLINIC_OR_DEPARTMENT_OTHER): Payer: Medicare Other | Attending: Internal Medicine

## 2023-07-25 VITALS — BP 127/56 | HR 58 | Temp 97.6°F | Resp 16 | Ht 62.0 in | Wt 149.0 lb

## 2023-07-25 DIAGNOSIS — D539 Nutritional anemia, unspecified: Secondary | ICD-10-CM

## 2023-07-25 DIAGNOSIS — D61818 Other pancytopenia: Secondary | ICD-10-CM | POA: Diagnosis present

## 2023-07-25 DIAGNOSIS — Z79899 Other long term (current) drug therapy: Secondary | ICD-10-CM | POA: Insufficient documentation

## 2023-07-25 DIAGNOSIS — D696 Thrombocytopenia, unspecified: Secondary | ICD-10-CM

## 2023-07-25 LAB — CBC WITH DIFFERENTIAL (CANCER CENTER ONLY)
Abs Immature Granulocytes: 0.01 10*3/uL (ref 0.00–0.07)
Basophils Absolute: 0.1 10*3/uL (ref 0.0–0.1)
Basophils Relative: 1 %
Eosinophils Absolute: 0.4 10*3/uL (ref 0.0–0.5)
Eosinophils Relative: 9 %
HCT: 32.1 % — ABNORMAL LOW (ref 36.0–46.0)
Hemoglobin: 10.9 g/dL — ABNORMAL LOW (ref 12.0–15.0)
Immature Granulocytes: 0 %
Lymphocytes Relative: 30 %
Lymphs Abs: 1.3 10*3/uL (ref 0.7–4.0)
MCH: 32.6 pg (ref 26.0–34.0)
MCHC: 34 g/dL (ref 30.0–36.0)
MCV: 96.1 fL (ref 80.0–100.0)
Monocytes Absolute: 0.6 10*3/uL (ref 0.1–1.0)
Monocytes Relative: 14 %
Neutro Abs: 1.9 10*3/uL (ref 1.7–7.7)
Neutrophils Relative %: 46 %
Platelet Count: 75 10*3/uL — ABNORMAL LOW (ref 150–400)
RBC: 3.34 MIL/uL — ABNORMAL LOW (ref 3.87–5.11)
RDW: 12.3 % (ref 11.5–15.5)
WBC Count: 4.2 10*3/uL (ref 4.0–10.5)
nRBC: 0 % (ref 0.0–0.2)

## 2023-07-25 LAB — LACTATE DEHYDROGENASE: LDH: 153 U/L (ref 98–192)

## 2023-07-25 NOTE — Progress Notes (Signed)
 [facility] [phone]   [phone]  OFFICE PROGRESS NOTE  Octavia Heir, NP [address] [place] [address]  DIAGNOSIS: Chronic mild pancytopenia. Differential diagnosis includes bone marrow disorder, nutritional deficiencies, or sequelae of past hepatitis B.   PRIOR THERAPY: None  CURRENT THERAPY: Observation.  INTERVAL HISTORY: Melissa Fletcher 83 y.o. female returns to the clinic today for follow-up visit accompanied by her daughter [name].Discussed the use of AI scribe software for clinical note transcription with the patient, who gave verbal consent to proceed.  History of Present Illness   Melissa Fletcher is an 83 year old female with pancytopenia who presents for follow-up of low blood counts. She is accompanied by her daughter.  She presents for follow-up of pancytopenia, characterized by low white blood cell count, low hemoglobin, and low platelets. Extensive blood work was performed during her last visit, including iron studies, ferritin, B12, folic acid, TSH, and hepatitis screening, all of which returned normal results. She underwent a bone marrow biopsy many years ago.  Currently, she is awaiting further evaluation to determine if a bone marrow disorder is present, as this is the next step in her diagnostic workup.       MEDICAL HISTORY: Past Medical History:  Diagnosis Date   Arthritis    Hepatitis B    Hypertension     ALLERGIES:  has no known allergies.  MEDICATIONS:  Current Outpatient Medications  Medication Sig Dispense Refill   atenolol (TENORMIN) 50 MG tablet TAKE 1 TABLET(50 MG) BY MOUTH DAILY 90 tablet 0   Cholecalciferol (VITAMIN D3 PO) Take 10 mg by mouth.     furosemide (LASIX) 20 MG tablet TAKE 1 TABLET(20 MG) BY MOUTH DAILY AS NEEDED FOR ANKLE SWELLING 30 tablet 0   potassium chloride (KLOR-CON) 10 MEQ tablet TAKE 1 TABLET(10 MEQ) BY MOUTH DAILY WITH FUROSEMIDE AS NEEDED 90 tablet 1   traMADol  (ULTRAM) 50 MG tablet TAKE 1 TABLET(50 MG) BY MOUTH TWICE DAILY AS NEEDED 60 tablet 1   valsartan (DIOVAN) 160 MG tablet TAKE 1 TABLET(160 MG) BY MOUTH DAILY 90 tablet 1   Current Facility-Administered Medications  Medication Dose Route Frequency Provider Last Rate Last Admin   diclofenac Sodium (VOLTAREN) 1 % topical gel 2 g  2 g Topical Daily PRN Fargo, [name] E, NP        SURGICAL HISTORY:  Past Surgical History:  Procedure Laterality Date   CHOLECYSTECTOMY     TOTAL HIP ARTHROPLASTY Right     REVIEW OF SYSTEMS:  A comprehensive review of systems was negative except for: Constitutional: positive for fatigue   PHYSICAL EXAMINATION: General appearance: alert, cooperative, fatigued, and no distress Head: Normocephalic, without obvious abnormality, atraumatic Neck: no adenopathy, no JVD, supple, symmetrical, trachea midline, and thyroid not enlarged, symmetric, no tenderness/mass/nodules Lymph nodes: Cervical, supraclavicular, and axillary nodes normal. Resp: clear to auscultation bilaterally Back: symmetric, no curvature. ROM normal. No CVA tenderness. Cardio: regular rate and rhythm, S1, S2 normal, no murmur, click, rub or gallop GI: soft, non-tender; bowel sounds normal; no masses,  no organomegaly Extremities: extremities normal, atraumatic, no cyanosis or edema  ECOG PERFORMANCE STATUS: 1 - Symptomatic but completely ambulatory  Blood pressure (!) 127/56, pulse (!) 58, temperature 97.6 F (36.4 C), temperature source Temporal, resp. rate 16, height 5\' 2"  (1.575 m), weight 149 lb (67.6 kg), last menstrual period 07/27/1992, SpO2 98%.  LABORATORY DATA: Lab Results  Component Value Date  WBC 3.6 (L) 06/27/2023   HGB 11.3 (L) 06/27/2023   HCT 34.2 (L) 06/27/2023   MCV 96.6 06/27/2023   PLT 76 (L) 06/27/2023      Chemistry      Component Value Date/Time   NA 138 06/27/2023 1241   K 4.1 06/27/2023 1241   CL 104 06/27/2023 1241   CO2 32 06/27/2023 1241   BUN 18 06/27/2023  1241   CREATININE 0.77 06/27/2023 1241   CREATININE 0.74 09/21/2022 0948      Component Value Date/Time   CALCIUM 10.5 (H) 06/27/2023 1241   ALKPHOS 90 06/27/2023 1241   AST 29 06/27/2023 1241   ALT 13 06/27/2023 1241   BILITOT 0.8 06/27/2023 1241       RADIOGRAPHIC STUDIES: No results found.  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 She had extensive blood work done last month that was unremarkable.    Pancytopenia Pancytopenia with low WBC, hemoglobin, and platelets. Initial workup (iron study, ferritin, B12, folic acid, TSH, hepatitis panel) was normal. Differential diagnosis includes potential bone marrow disorder. Discussed necessity of bone marrow biopsy, emphasizing its less invasive nature with mild sedation and local anesthesia, performed by an interventional radiologist. Explained importance of identifying the underlying cause to determine treatability. Patient expressed concern about the procedure due to age; reiterated the critical need for diagnosis. - Order bone marrow biopsy - Coordinate with interventional radiology for scheduling - Review blood count results when available - Follow-up in one month to discuss biopsy results and further management  Follow-up - Schedule follow-up appointment in one month.   The patient was advised to call immediately if she has any other concerning symptoms in the interval. The patient voices understanding of current disease status and treatment options and is in agreement with the current care plan.  All questions were answered. The patient knows to call the clinic with any problems, questions or concerns. We can certainly see the patient much sooner if necessary.  The total time spent in the appointment was 20 minutes.  Disclaimer: This note was dictated with voice recognition software. Similar sounding words can inadvertently be  transcribed and may not be corrected upon review.

## 2023-08-15 ENCOUNTER — Other Ambulatory Visit: Payer: Self-pay | Admitting: Orthopedic Surgery

## 2023-08-15 DIAGNOSIS — M25473 Effusion, unspecified ankle: Secondary | ICD-10-CM

## 2023-08-20 ENCOUNTER — Other Ambulatory Visit: Payer: Self-pay | Admitting: Radiology

## 2023-08-20 ENCOUNTER — Other Ambulatory Visit: Payer: Self-pay | Admitting: Orthopedic Surgery

## 2023-08-20 DIAGNOSIS — I10 Essential (primary) hypertension: Secondary | ICD-10-CM

## 2023-08-20 DIAGNOSIS — D696 Thrombocytopenia, unspecified: Secondary | ICD-10-CM

## 2023-08-20 DIAGNOSIS — G8929 Other chronic pain: Secondary | ICD-10-CM

## 2023-08-20 NOTE — Telephone Encounter (Signed)
 Patient is requesting a refill of the following medications: Requested Prescriptions   Pending Prescriptions Disp Refills   traMADol (ULTRAM) 50 MG tablet [Pharmacy Med Name: TRAMADOL 50MG  TABLETS] 60 tablet     Sig: TAKE 1 TABLET(50 MG) BY MOUTH TWICE DAILY AS NEEDED    Date of last refill: 05/14/23  Refill amount: 60/1  Treatment agreement date: 06/02/2021

## 2023-08-20 NOTE — H&P (Signed)
 Chief Complaint: Chronic pancytopenia - Referral to IR for bone marrow biopsy  Referring Provider(s): Dr. Si Gaul  Supervising Physician: Roanna Banning  Patient Status: [facility] - Out-pt  History of Present Illness: Melissa Fletcher is a 83 y.o. female with hx of , Hep B, arthritis, HTN, chronic mild pancytopenia presenting to IR service for CT guided bone marrow biopsy. Follows with Dr. Arbutus Ped from oncology. Recent labs from 06/27/23, including iron studies, ferritin, B12, folic acid, TSH, and hepatitis screening, all of which returned normal results. Referral now for next step of diagnostic workup.  *** Patient is Full Code  Past Medical History:  Diagnosis Date   Arthritis    Hepatitis B    Hypertension     Past Surgical History:  Procedure Laterality Date   CHOLECYSTECTOMY     TOTAL HIP ARTHROPLASTY Right     Allergies: Patient has no known allergies.  Medications: Prior to Admission medications   Medication Sig Start Date End Date Taking? Authorizing Provider  atenolol (TENORMIN) 50 MG tablet TAKE 1 TABLET(50 MG) BY MOUTH DAILY 08/20/23   Fargo, [name] E, NP  Cholecalciferol (VITAMIN D3 PO) Take 10 mg by mouth.    [provider]  furosemide (LASIX) 20 MG tablet TAKE 1 TABLET(20 MG) BY MOUTH DAILY AS NEEDED FOR ANKLE SWELLING 08/15/23   Fargo, [name] E, NP  potassium chloride (KLOR-CON) 10 MEQ tablet TAKE 1 TABLET(10 MEQ) BY MOUTH DAILY WITH FUROSEMIDE AS NEEDED 04/12/23   Fargo, [name] E, NP  traMADol (ULTRAM) 50 MG tablet TAKE 1 TABLET(50 MG) BY MOUTH TWICE DAILY AS NEEDED 08/20/23   Fargo, [name] E, NP  valsartan (DIOVAN) 160 MG tablet TAKE 1 TABLET(160 MG) BY MOUTH DAILY 09/21/22   Octavia Heir, NP     Family History  Problem Relation Age of Onset   Stroke Mother    Cancer Father        Brain   Healthy Daughter    Healthy Daughter    Healthy Son     Social History   Socioeconomic History   Marital status: Widowed    Spouse name: Not on file   Number of  children: Not on file   Years of education: Not on file   Highest education level: Not on file  Occupational History   Not on file  Tobacco Use   Smoking status: Former    Current packs/day: 0.00    Average packs/day: 0.5 packs/day for 40.0 years (20.0 ttl pk-yrs)    Types: Cigarettes    Start date: 31    Quit date: 2000    Years since quitting: 25.2   Smokeless tobacco: Never  Vaping Use   Vaping status: Never Used  Substance and Sexual Activity   Alcohol use: Never   Drug use: Never   Sexual activity: Not on file  Other Topics Concern   Not on file  Social History Narrative   Diet:      Caffeine: Coffee and tea      Married, if yes what year: Widowed, 1958      Do you live in a house, apartment, assisted living, condo, trailer, ect: Apartment      Is it one or more stories: Yes- elevator      How many persons live in your home? 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 No   Do you have difficulty preparing food or eating? No   Do you have difficulty managing your medications? No   Do you have difficulty managing your finances? Tes, daughter   Do you have difficulty affording your medications? No   Social Drivers of Corporate investment banker Strain: Low Risk  (09/08/2021)   Overall Financial Resource Strain (CARDIA)    Difficulty of Paying Living Expenses: Not hard at all  Food Insecurity: No Food Insecurity (09/08/2021)   Hunger Vital Sign    Worried About Running Out of Food in the Last Year: Never true    Ran Out of Food in the Last Year: Never true  Transportation Needs: No Transportation Needs (09/08/2021)   PRAPARE - Administrator, Civil Service (Medical):  No    Lack of Transportation (Non-Medical): No  Physical Activity: Inactive (09/08/2021)   Exercise Vital Sign    Days of Exercise per Week: 0 days    Minutes of Exercise per Session: 0 min  Stress: No Stress Concern Present (09/08/2021)   [employer] of Occupational Health - Occupational Stress Questionnaire    Feeling of Stress : Only a little  Social Connections: Socially Isolated (09/08/2021)   Social Connection and Isolation Panel [NHANES]    Frequency of Communication with Friends and Family: More than three times a week    Frequency of Social Gatherings with Friends and Family: Three times a week    Attends Religious Services: Never    Active Member of Clubs or Organizations: No    Attends Banker Meetings: Never    Marital Status: Widowed     Review of Systems: A 12 point ROS discussed and pertinent positives are indicated in the HPI above.  All other systems are negative.  Review of Systems  Vital Signs: LMP 07/27/1992   Advance Care Plan: No documents on file  Physical Exam  Imaging: No results found.  Labs:  CBC: Recent Labs    10/24/22 2002 05/22/23 1008 06/27/23 1241 07/25/23 0743  WBC 3.8* 2.8* 3.6* 4.2  HGB 12.4 11.0* 11.3* 10.9*  HCT 37.6 33.7* 34.2* 32.1*  PLT 99* 101* 76* 75*    COAGS: No results for input(s): "INR", "APTT" in the last 8760 hours.  BMP: Recent Labs    09/21/22 0948 10/24/22 2002 06/27/23 1241  NA 140 140 138  K 4.0 3.9 4.1  CL 105 105 104  CO2 29 30 32  GLUCOSE 91 85 86  BUN 15 13 18   CALCIUM 10.3 10.4* 10.5*  CREATININE 0.74 0.63 0.77  GFRNONAA  --  >60 >60    LIVER FUNCTION TESTS: Recent Labs    09/21/22 0948 10/24/22 2002 06/27/23 1241  BILITOT 0.7 0.6 0.8  AST 29 33 29  ALT 16 15 13   ALKPHOS  --  99 90  PROT 7.7 7.7 7.6  ALBUMIN  --  3.8 3.7    TUMOR MARKERS: No results for input(s): "AFPTM", "CEA", "CA199", "CHROMGRNA" in the last 8760 hours.  Assessment and Plan:  83 y/o F  with hx of Hep B,  arthritis, HTN, chronic mild pancytopenia presenting to IR service for CT guided bone marrow biopsy. Recent lab work 06/27/23, including iron studies, ferritin, B12, folic acid, TSH, and hepatitis screening, all of which returned normal results. Per Dr. Arbutus Ped, differential diagnosis includes bone marrow disorder, nutritional deficiencies, or sequelae of past hepatitis B   Risks and benefits of CT guided bone marrow biopsy was discussed with the patient and/or patient's family including, but not limited to bleeding, infection, damage to adjacent structures or low yield requiring additional tests.  All of the questions were answered and there is agreement to proceed.  Consent signed and in chart.   Thank you for allowing our service to participate in Melissa Fletcher 's care.  Electronically Signed: Katheren Puller, PA-C   08/20/2023, 11:48 AM      I spent a total of  30 Minutes   in face to face in clinical consultation, greater than 50% of which was counseling/coordinating care for CT bone marrow biopsy.

## 2023-08-21 ENCOUNTER — Ambulatory Visit (HOSPITAL_COMMUNITY)
Admission: RE | Admit: 2023-08-21 | Discharge: 2023-08-21 | Disposition: A | Discharge status: Home / Self Care | Source: Ambulatory Visit | Attending: Internal Medicine | Admitting: Internal Medicine

## 2023-08-21 ENCOUNTER — Other Ambulatory Visit: Payer: Self-pay

## 2023-08-21 ENCOUNTER — Encounter (HOSPITAL_COMMUNITY): Payer: Self-pay

## 2023-08-21 ENCOUNTER — Telehealth: Payer: Self-pay | Admitting: Physician Assistant

## 2023-08-21 DIAGNOSIS — D61818 Other pancytopenia: Secondary | ICD-10-CM | POA: Insufficient documentation

## 2023-08-21 DIAGNOSIS — Z01818 Encounter for other preprocedural examination: Secondary | ICD-10-CM | POA: Insufficient documentation

## 2023-08-21 DIAGNOSIS — Z8619 Personal history of other infectious and parasitic diseases: Secondary | ICD-10-CM | POA: Insufficient documentation

## 2023-08-21 DIAGNOSIS — D696 Thrombocytopenia, unspecified: Secondary | ICD-10-CM

## 2023-08-21 DIAGNOSIS — I10 Essential (primary) hypertension: Secondary | ICD-10-CM | POA: Insufficient documentation

## 2023-08-21 LAB — CBC WITH DIFFERENTIAL/PLATELET
Abs Immature Granulocytes: 0 10*3/uL (ref 0.00–0.07)
Basophils Absolute: 0 10*3/uL (ref 0.0–0.1)
Basophils Relative: 1 %
Eosinophils Absolute: 0.3 10*3/uL (ref 0.0–0.5)
Eosinophils Relative: 8 %
HCT: 29.8 % — ABNORMAL LOW (ref 36.0–46.0)
Hemoglobin: 10.9 g/dL — ABNORMAL LOW (ref 12.0–15.0)
Immature Granulocytes: 0 %
Lymphocytes Relative: 26 %
Lymphs Abs: 0.9 10*3/uL (ref 0.7–4.0)
MCH: 38.7 pg — ABNORMAL HIGH (ref 26.0–34.0)
MCHC: 36.6 g/dL — ABNORMAL HIGH (ref 30.0–36.0)
MCV: 105.7 fL — ABNORMAL HIGH (ref 80.0–100.0)
Monocytes Absolute: 0.5 10*3/uL (ref 0.1–1.0)
Monocytes Relative: 16 %
Neutro Abs: 1.7 10*3/uL (ref 1.7–7.7)
Neutrophils Relative %: 49 %
Platelets: 82 10*3/uL — ABNORMAL LOW (ref 150–400)
RBC: 2.82 MIL/uL — ABNORMAL LOW (ref 3.87–5.11)
RDW: 14.6 % (ref 11.5–15.5)
WBC: 3.4 10*3/uL — ABNORMAL LOW (ref 4.0–10.5)
nRBC: 0 % (ref 0.0–0.2)

## 2023-08-21 MED ORDER — SODIUM CHLORIDE 0.9 % IV SOLN: INTRAVENOUS | Status: DC

## 2023-08-21 MED ORDER — MIDAZOLAM HCL 2 MG/2ML IJ SOLN
INTRAMUSCULAR | Status: AC | PRN
  Administered 2023-08-21: 1 mg via INTRAVENOUS

## 2023-08-21 MED ORDER — FENTANYL CITRATE (PF) 100 MCG/2ML IJ SOLN
INTRAMUSCULAR | Status: AC
  Filled 2023-08-21: qty 2

## 2023-08-21 MED ORDER — MIDAZOLAM HCL 2 MG/2ML IJ SOLN
INTRAMUSCULAR | Status: AC
Start: 2023-08-21 — End: ?
  Filled 2023-08-21: qty 2

## 2023-08-21 NOTE — Discharge Instructions (Addendum)
Discharge Instructions:   Please call Interventional Radiology clinic [phone] with any questions or concerns.  You may remove your dressing and shower tomorrow.    Bone Marrow Aspiration and Bone Marrow Biopsy, Adult, Care After This sheet gives you information about how to care for yourself after your procedure. Your health care provider may also give you more specific instructions. If you have problems or questions, contact your health care provider. What can I expect after the procedure? After the procedure, it is common to have: Mild pain and tenderness. Swelling. Bruising. Follow these instructions at home: Puncture site care  Follow instructions from your health care provider about how to take care of the puncture site. Make sure you: Wash your hands with soap and water before and after you change your bandage (dressing). If soap and water are not available, use hand sanitizer. Change your dressing as told by your health care provider. Check your puncture site every day for signs of infection. Check for: More redness, swelling, or pain. Fluid or blood. Warmth. Pus or a bad smell. Activity Return to your normal activities as told by your health care provider. Ask your health care provider what activities are safe for you. Do not lift anything that is heavier than 10 lb (4.5 kg), or the limit that you are told, until your health care provider says that it is safe. Do not drive for 24 hours if you were given a sedative during your procedure. General instructions  Take over-the-counter and prescription medicines only as told by your health care provider. Do not take baths, swim, or use a hot tub until your health care provider approves. Ask your health care provider if you may take showers. You may only be allowed to take sponge baths. If directed, put ice on the affected area. To do this: Put ice in a plastic bag. Place a towel between your skin and the bag. Leave the ice  on for 20 minutes, 2-3 times a day. Keep all follow-up visits as told by your health care provider. This is important. Contact a health care provider if: Your pain is not controlled with medicine. You have a fever. You have more redness, swelling, or pain around the puncture site. You have fluid or blood coming from the puncture site. Your puncture site feels warm to the touch. You have pus or a bad smell coming from the puncture site. Summary After the procedure, it is common to have mild pain, tenderness, swelling, and bruising. Follow instructions from your health care provider about how to take care of the puncture site and what activities are safe for you. Take over-the-counter and prescription medicines only as told by your health care provider. Contact a health care provider if you have any signs of infection, such as fluid or blood coming from the puncture site. This information is not intended to replace advice given to you by your health care provider. Make sure you discuss any questions you have with your health care provider. Document Revised: 09/24/2018 Document Reviewed: 09/24/2018 Elsevier Patient Education  2023 Elsevier Inc.   Moderate Conscious Sedation, Adult, Care After This sheet gives you information about how to care for yourself after your procedure. Your health care provider may also give you more specific instructions. If you have problems or questions, contact your health care provider. What can I expect after the procedure? After the procedure, it is common to have: Sleepiness for several hours. Impaired judgment for several hours. Difficulty with balance. Vomiting if   you eat too soon. Follow these instructions at home: For the time period you were told by your health care provider: Rest. Do not participate in activities where you could fall or become injured. Do not drive or use machinery. Do not drink alcohol. Do not take sleeping pills or medicines that  cause drowsiness. Do not make important decisions or sign legal documents. Do not take care of children on your own. Eating and drinking  Follow the diet recommended by your health care provider. Drink enough fluid to keep your urine pale yellow. If you vomit: Drink water, juice, or soup when you can drink without vomiting. Make sure you have little or no nausea before eating solid foods. General instructions Take over-the-counter and prescription medicines only as told by your health care provider. Have a responsible adult stay with you for the time you are told. It is important to have someone help care for you until you are awake and alert. Do not smoke. Keep all follow-up visits as told by your health care provider. This is important. Contact a health care provider if: You are still sleepy or having trouble with balance after 24 hours. You feel light-headed. You keep feeling nauseous or you keep vomiting. You develop a rash. You have a fever. You have redness or swelling around the IV site. Get help right away if: You have trouble breathing. You have new-onset confusion at home. Summary After the procedure, it is common to feel sleepy, have impaired judgment, or feel nauseous if you eat too soon. Rest after you get home. Know the things you should not do after the procedure. Follow the diet recommended by your health care provider and drink enough fluid to keep your urine pale yellow. Get help right away if you have trouble breathing or new-onset confusion at home. This information is not intended to replace advice given to you by your health care provider. Make sure you discuss any questions you have with your health care provider. Document Revised: 09/05/2019 Document Reviewed: 04/03/2019 Elsevier Patient Education  2023 Elsevier Inc.  

## 2023-08-21 NOTE — H&P (Addendum)
 Chief Complaint: Patient was seen in consultation today for pancytopenia   at the request of [facility]  Referring Physician(s): Mohamed,Mohamed  Supervising Physician: Roanna Banning  Patient Status: [facility] - Out-pt  Full Code  History of Present Illness: Melissa Fletcher is a 83 y.o. female with history of hepatitis B, arthritis, hypertension and chronic pancytopenia. Patient currently follows Dr. Arbutus Ped with oncology. Patient had updated labwork 07/25/23, which reported RBC 3.34, HGB 10.9, HCT 32.1, platelets 75, and WBC 4.2. Patient was referred to IR to assist with a bone marrow biopsy. Patient reports feeling well today. Denies: chest pain, shortness of breath, nausea, vomiting, diarrhea, abdominal pain, cough, and/or fever.   Past Medical History:  Diagnosis Date   Arthritis    Hepatitis B    Hypertension     Past Surgical History:  Procedure Laterality Date   CHOLECYSTECTOMY     TOTAL HIP ARTHROPLASTY Right     Allergies: Patient has no known allergies.  Medications: Prior to Admission medications   Medication Sig Start Date End Date Taking? Authorizing Provider  atenolol (TENORMIN) 50 MG tablet TAKE 1 TABLET(50 MG) BY MOUTH DAILY 08/20/23  Yes Fargo, [name] E, NP  Cholecalciferol (VITAMIN D3 PO) Take 10 mg by mouth.   Yes [provider]  furosemide (LASIX) 20 MG tablet TAKE 1 TABLET(20 MG) BY MOUTH DAILY AS NEEDED FOR ANKLE SWELLING 08/15/23  Yes Fargo, [name] E, NP  potassium chloride (KLOR-CON) 10 MEQ tablet TAKE 1 TABLET(10 MEQ) BY MOUTH DAILY WITH FUROSEMIDE AS NEEDED 04/12/23  Yes Fargo, [name] E, NP  traMADol (ULTRAM) 50 MG tablet TAKE 1 TABLET(50 MG) BY MOUTH TWICE DAILY AS NEEDED 08/20/23  Yes Fargo, [name] E, NP  valsartan (DIOVAN) 160 MG tablet TAKE 1 TABLET(160 MG) BY MOUTH DAILY 09/21/22  Yes Fargo, [name] E, NP     Family History  Problem Relation Age of Onset   Stroke Mother    Cancer Father        Brain   Healthy Daughter    Healthy Daughter     Healthy Son     Social History   Socioeconomic History   Marital status: Widowed    Spouse name: Not on file   Number of children: Not on file   Years of education: Not on file   Highest education level: Not on file  Occupational History   Not on file  Tobacco Use   Smoking status: Former    Current packs/day: 0.00    Average packs/day: 0.5 packs/day for 40.0 years (20.0 ttl pk-yrs)    Types: Cigarettes    Start date: 60    Quit date: 2000    Years since quitting: 25.2   Smokeless tobacco: Never  Vaping Use   Vaping status: Never Used  Substance and Sexual Activity   Alcohol use: Never   Drug use: Never   Sexual activity: Not on file  Other Topics Concern   Not on file  Social History Narrative   Diet:      Caffeine: Coffee and tea      Married, if yes what year: Widowed, 1958      Do you live in a house, apartment, assisted living, condo, trailer, ect: Apartment      Is it one or more stories: Yes- elevator      How many persons live in your home? 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 No   Do you have difficulty preparing food or eating? No   Do you have difficulty managing your medications? No   Do you have difficulty managing your finances? Tes, daughter   Do you have difficulty affording your medications? No   Social Drivers of Corporate investment banker Strain: Low Risk  (09/08/2021)   Overall Financial Resource Strain (CARDIA)    Difficulty of Paying Living Expenses: Not hard at all  Food Insecurity: No Food Insecurity (09/08/2021)   Hunger Vital Sign    Worried About Running Out of Food in the Last Year: Never true    Ran Out of Food in the Last Year: Never  true  Transportation Needs: No Transportation Needs (09/08/2021)   PRAPARE - Administrator, Civil Service (Medical): No    Lack of Transportation (Non-Medical): No  Physical Activity: Inactive (09/08/2021)   Exercise Vital Sign    Days of Exercise per Week: 0 days    Minutes of Exercise per Session: 0 min  Stress: No Stress Concern Present (09/08/2021)   [employer] of Occupational Health - Occupational Stress Questionnaire    Feeling of Stress : Only a little  Social Connections: Socially Isolated (09/08/2021)   Social Connection and Isolation Panel [NHANES]    Frequency of Communication with Friends and Family: More than three times a week    Frequency of Social Gatherings with Friends and Family: Three times a week    Attends Religious Services: Never    Active Member of Clubs or Organizations: No    Attends Banker Meetings: Never    Marital Status: Widowed    Review of Systems: A 12 point ROS discussed and pertinent positives are indicated in the HPI above.  All other systems are negative.  Review of Systems  Constitutional:  Negative for fever.  Respiratory:  Negative for cough and shortness of breath.   Cardiovascular:  Negative for chest pain.  Gastrointestinal:  Negative for abdominal pain, diarrhea, nausea and vomiting.  Psychiatric/Behavioral:  Negative for confusion.     Vital Signs: BP (!) 103/57   Pulse 65   Temp 98.4 F (36.9 C) (Oral)   Resp 16   Ht 5\' 2"  (1.575 m)   Wt 143 lb (64.9 kg)   LMP 07/27/1992   SpO2 94%   BMI 26.16 kg/m   Advance Care Plan: The advanced care plan/surrogate decision maker was discussed at the time of visit and documented in the medical record.    Physical Exam HENT:     Head: Normocephalic and atraumatic.     Mouth/Throat:     Mouth: Mucous membranes are moist.     Pharynx: Oropharynx is clear.  Cardiovascular:     Rate and Rhythm: Normal rate and regular rhythm.  Pulmonary:     Effort:  Pulmonary effort is normal.     Breath sounds: Wheezing present.  Abdominal:     General: Abdomen is flat.  Skin:    General: Skin is warm.  Neurological:     Mental Status: She is  alert and oriented to person, place, and time.  Psychiatric:        Mood and Affect: Mood normal.        Thought Content: Thought content normal.     Imaging: No results found.  Labs:  CBC: Recent Labs    10/24/22 2002 05/22/23 1008 06/27/23 1241 07/25/23 0743  WBC 3.8* 2.8* 3.6* 4.2  HGB 12.4 11.0* 11.3* 10.9*  HCT 37.6 33.7* 34.2* 32.1*  PLT 99* 101* 76* 75*    COAGS: No results for input(s): "INR", "APTT" in the last 8760 hours.  BMP: Recent Labs    09/21/22 0948 10/24/22 2002 06/27/23 1241  NA 140 140 138  K 4.0 3.9 4.1  CL 105 105 104  CO2 29 30 32  GLUCOSE 91 85 86  BUN 15 13 18   CALCIUM 10.3 10.4* 10.5*  CREATININE 0.74 0.63 0.77  GFRNONAA  --  >60 >60    LIVER FUNCTION TESTS: Recent Labs    09/21/22 0948 10/24/22 2002 06/27/23 1241  BILITOT 0.7 0.6 0.8  AST 29 33 29  ALT 16 15 13   ALKPHOS  --  99 90  PROT 7.7 7.7 7.6  ALBUMIN  --  3.8 3.7    TUMOR MARKERS: No results for input(s): "AFPTM", "CEA", "CA199", "CHROMGRNA" in the last 8760 hours.  Assessment and Plan: Pancytopenia   Patient is a 83 y/o female with pancytopenia. Patient presents today for a bone marrow biopsy at the request of Dr. Arbutus Ped. Patient is feeling well today. Vitals reviewed. Patient has evidence of wheezing on exam today. Patient is AAOx3. Her daughter is present.   Risks and benefits of bone marrow biopsy with aspiration was discussed with the patient and/or patient's family including, but not limited to bleeding, infection, damage to adjacent structures or low yield requiring additional tests.  All of the questions were answered and there is agreement to proceed.  Consent signed and in chart.   Thank you for this interesting consult.  I greatly enjoyed meeting [address]  and look forward to participating in their care.  A copy of this report was sent to the requesting provider on this date.  Electronically Signed: Rosalita Levan, PA 08/21/2023, 8:29 AM   I spent a total of  30 Minutes   in face to face in clinical consultation, greater than 50% of which was counseling/coordinating care for pancytopenia

## 2023-08-21 NOTE — Telephone Encounter (Signed)
 Rescheduled appointment around bone marrow biopsy procedure. The patients daughter is aware of the changes made.

## 2023-08-21 NOTE — Procedures (Signed)
 Vascular and Interventional Radiology Procedure Note  Patient: Melissa Fletcher DOB: 1940-07-07 Medical Record Number: [id number] Note Date/Time: 08/21/23 9:49 AM   Performing Physician: Roanna Banning, MD Assistant(s): None  Diagnosis: Pancytopenia   Procedure: BONE MARROW ASPIRATION and BIOPSY  Anesthesia: Conscious Sedation Complications: None Estimated Blood Loss: Minimal Specimens: Sent for Pathology  Findings:  Successful CT-guided bone marrow aspiration and biopsy A total of 1 cores were obtained. Hemostasis of the tract was achieved using Manual Pressure.  Plan: Bed rest for 1 hours.  See detailed procedure note with images in PACS. The patient tolerated the procedure well without incident or complication and was returned to Recovery in stable condition.    Roanna Banning, MD [facility] Radiology   Pager. [phone] Clinic. [phone]

## 2023-08-23 ENCOUNTER — Inpatient Hospital Stay: Admitting: Internal Medicine

## 2023-08-23 ENCOUNTER — Inpatient Hospital Stay

## 2023-08-24 LAB — SURGICAL PATHOLOGY

## 2023-08-29 ENCOUNTER — Encounter (HOSPITAL_COMMUNITY): Payer: Self-pay | Admitting: Internal Medicine

## 2023-08-30 ENCOUNTER — Inpatient Hospital Stay (HOSPITAL_BASED_OUTPATIENT_CLINIC_OR_DEPARTMENT_OTHER): Admitting: Internal Medicine

## 2023-08-30 ENCOUNTER — Inpatient Hospital Stay: Attending: Internal Medicine

## 2023-08-30 VITALS — BP 147/61 | HR 57 | Temp 97.9°F | Resp 17 | Wt 142.5 lb

## 2023-08-30 DIAGNOSIS — D696 Thrombocytopenia, unspecified: Secondary | ICD-10-CM

## 2023-08-30 DIAGNOSIS — Z79899 Other long term (current) drug therapy: Secondary | ICD-10-CM | POA: Diagnosis not present

## 2023-08-30 DIAGNOSIS — D61818 Other pancytopenia: Secondary | ICD-10-CM | POA: Insufficient documentation

## 2023-08-30 LAB — CBC WITH DIFFERENTIAL (CANCER CENTER ONLY)
Abs Immature Granulocytes: 0.01 10*3/uL (ref 0.00–0.07)
Basophils Absolute: 0 10*3/uL (ref 0.0–0.1)
Basophils Relative: 1 %
Eosinophils Absolute: 0.3 10*3/uL (ref 0.0–0.5)
Eosinophils Relative: 7 %
HCT: 33.8 % — ABNORMAL LOW (ref 36.0–46.0)
Hemoglobin: 11.8 g/dL — ABNORMAL LOW (ref 12.0–15.0)
Immature Granulocytes: 0 %
Lymphocytes Relative: 23 %
Lymphs Abs: 1 10*3/uL (ref 0.7–4.0)
MCH: 33.9 pg (ref 26.0–34.0)
MCHC: 34.9 g/dL (ref 30.0–36.0)
MCV: 97.1 fL (ref 80.0–100.0)
Monocytes Absolute: 0.7 10*3/uL (ref 0.1–1.0)
Monocytes Relative: 17 %
Neutro Abs: 2.3 10*3/uL (ref 1.7–7.7)
Neutrophils Relative %: 52 %
Platelet Count: 93 10*3/uL — ABNORMAL LOW (ref 150–400)
RBC: 3.48 MIL/uL — ABNORMAL LOW (ref 3.87–5.11)
RDW: 12.3 % (ref 11.5–15.5)
WBC Count: 4.5 10*3/uL (ref 4.0–10.5)
nRBC: 0 % (ref 0.0–0.2)

## 2023-08-30 NOTE — Progress Notes (Signed)
 [facility] [phone]   Fax:([phone]  OFFICE PROGRESS NOTE  Melissa Heir, NP [address] [place] [address]  DIAGNOSIS: Chronic mild pancytopenia. Differential diagnosis includes bone marrow disorder, nutritional deficiencies, or sequelae of past hepatitis B.   PRIOR THERAPY: None  CURRENT THERAPY: Observation.  INTERVAL HISTORY: Melissa Fletcher 83 y.o. female returns to the clinic today for follow-up visit accompanied by her daughter Melissa Fletcher. Discussed the use of AI scribe software for clinical note transcription with the patient, who gave verbal consent to proceed.  History of Present Illness   Melissa Fletcher is an 83 year old female who presents for evaluation after a recent bone marrow biopsy and aspirate. She is accompanied by her daughter, Melissa Fletcher.  She has a history of chronic mild pancytopenia of unclear etiology. There have been no new complaints since her last visit. No chest pain, breathing issues, nausea, vomiting, diarrhea, or weight loss. However, her weight decreased from 149 pounds to 142.5 pounds, possibly due to differences in clothing during weigh-ins.  Her recent complete blood count shows improvement with a white blood cell count of 4.5, hemoglobin increased to 11.8 from 10.9, and platelet count improved to 93,000 from previous counts of 82,000 and 75,000.         MEDICAL HISTORY: Past Medical History:  Diagnosis Date   Arthritis    Hepatitis B    Hypertension     ALLERGIES:  has no known allergies.  MEDICATIONS:  Current Outpatient Medications  Medication Sig Dispense Refill   atenolol (TENORMIN) 50 MG tablet TAKE 1 TABLET(50 MG) BY MOUTH DAILY 90 tablet 0   Cholecalciferol (VITAMIN D3 PO) Take 10 mg by mouth.     furosemide (LASIX) 20 MG tablet TAKE 1 TABLET(20 MG) BY MOUTH DAILY AS NEEDED FOR ANKLE SWELLING 30 tablet 0   potassium chloride (KLOR-CON) 10 MEQ tablet TAKE 1 TABLET(10 MEQ) BY MOUTH DAILY WITH  FUROSEMIDE AS NEEDED 90 tablet 1   traMADol (ULTRAM) 50 MG tablet TAKE 1 TABLET(50 MG) BY MOUTH TWICE DAILY AS NEEDED 60 tablet 0   valsartan (DIOVAN) 160 MG tablet TAKE 1 TABLET(160 MG) BY MOUTH DAILY 90 tablet 1   Current Facility-Administered Medications  Medication Dose Route Frequency Provider Last Rate Last Admin   diclofenac Sodium (VOLTAREN) 1 % topical gel 2 g  2 g Topical Daily PRN Fargo, Melissa Fletcher E, NP        SURGICAL HISTORY:  Past Surgical History:  Procedure Laterality Date   CHOLECYSTECTOMY     TOTAL HIP ARTHROPLASTY Right     REVIEW OF SYSTEMS:  A comprehensive review of systems was negative.   PHYSICAL EXAMINATION: General appearance: alert, cooperative, and no distress Head: Normocephalic, without obvious abnormality, atraumatic Neck: no adenopathy, no JVD, supple, symmetrical, trachea midline, and thyroid not enlarged, symmetric, no tenderness/mass/nodules Lymph nodes: Cervical, supraclavicular, and axillary nodes normal. Resp: clear to auscultation bilaterally Back: symmetric, no curvature. ROM normal. No CVA tenderness. Cardio: regular rate and rhythm, S1, S2 normal, no murmur, click, rub or gallop GI: soft, non-tender; bowel sounds normal; no masses,  no organomegaly Extremities: extremities normal, atraumatic, no cyanosis or edema  ECOG PERFORMANCE STATUS: 1 - Symptomatic but completely ambulatory  Blood pressure (!) 147/61, pulse (!) 57, temperature 97.9 F (36.6 C), temperature source Temporal, resp. rate 17, weight 142 lb 8 oz (64.6 kg), last menstrual period 07/27/1992, SpO2 92%.  LABORATORY DATA: Lab Results  Component Value Date  WBC 3.4 (L) 08/21/2023   HGB 10.9 (L) 08/21/2023   HCT 29.8 (L) 08/21/2023   MCV 105.7 (H) 08/21/2023   PLT 82 (L) 08/21/2023      Chemistry      Component Value Date/Time   NA 138 06/27/2023 1241   K 4.1 06/27/2023 1241   CL 104 06/27/2023 1241   CO2 32 06/27/2023 1241   BUN 18 06/27/2023 1241   CREATININE 0.77  06/27/2023 1241   CREATININE 0.74 09/21/2022 0948      Component Value Date/Time   CALCIUM 10.5 (H) 06/27/2023 1241   ALKPHOS 90 06/27/2023 1241   AST 29 06/27/2023 1241   ALT 13 06/27/2023 1241   BILITOT 0.8 06/27/2023 1241       RADIOGRAPHIC STUDIES: CT BONE MARROW BIOPSY & ASPIRATION Result Date: 08/21/2023 INDICATION: Evaluation of pancytopenia EXAM: CT GUIDED BONE MARROW ASPIRATION AND CORE BIOPSY MEDICATIONS: None. ANESTHESIA/SEDATION: Local anesthetic and single agent sedation was employed during this procedure. A total of Versed 1 mg was administered intravenously. The patient's level of consciousness and vital signs were monitored continuously by radiology nursing throughout the procedure under my direct supervision. FLUOROSCOPY TIME:  CT dose; 176 mGycm COMPLICATIONS: None immediate. Estimated blood loss: <5 mL PROCEDURE: RADIATION DOSE REDUCTION: This exam was performed according to the departmental dose-optimization program which includes automated exposure control, adjustment of the mA and/or kV according to patient size and/or use of iterative reconstruction technique. Informed written consent was obtained from the patient and/or patient's representative after a thorough discussion of the procedural risks, benefits and alternatives. All questions were addressed. Maximal Sterile Barrier Technique was utilized including caps, mask, sterile gowns, sterile gloves, sterile drape, hand hygiene and skin antiseptic. A timeout was performed prior to the initiation of the procedure. The patient was positioned prone and non-contrast localization CT was performed of the pelvis to demonstrate the iliac marrow spaces. Maximal barrier sterile technique utilized including caps, mask, sterile gowns, sterile gloves, large sterile drape, hand hygiene, and chlorhexidine prep. Under sterile conditions and local anesthesia, an 11 gauge coaxial bone biopsy needle was advanced into the RIGHT iliac marrow space.  Needle position was confirmed with CT imaging. Initially, bone marrow aspiration was performed. Next, the 11 gauge outer cannula was utilized to obtain a 1 iliac bone marrow core biopsy. Needle was removed. Hemostasis was obtained with compression. The patient tolerated the procedure well. Samples were prepared with the cytotechnologist. IMPRESSION: Successful CT-guided bone marrow aspiration and biopsy. Melissa Banning, MD [facility] Interventional Radiology [facility] Radiology Electronically Signed   By: Melissa Fletcher M.D.   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 She had extensive blood work done last month that was unremarkable. She had a bone marrow biopsy and aspirate performed recently that showed no concerning findings of bone marrow abnormality.    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 Recent bone marrow biopsy and aspirate showed no evidence of leukemia, lymphoma, or significant dysplasia. Secondary causes outside the bone marrow are considered. Current blood counts show improvement with white blood count at 4.5, hemoglobin at 11.8, and platelets at 93,000, indicating a positive trend. No further invasive procedures are planned as the bone marrow results are reassuring. - Monitor blood counts - Schedule follow-up in 6 months  Weight loss Mild weight loss from 149 lbs to 142.5 lbs, possibly due to  clothing or non-pathological factors. No other symptoms reported. - Monitor weight   The patient was advised to call immediately if she has any other concerning symptoms in the interval. The patient voices understanding of current disease status and treatment options and is in agreement with the current care plan.  All questions were answered. The patient knows to call the clinic with any problems, questions or concerns. We can  certainly see the patient much sooner if necessary.  The total time spent in the appointment was 20 minutes.  Disclaimer: This note was dictated with voice recognition software. Similar sounding words can inadvertently be transcribed and may not be corrected upon review.

## 2023-09-06 ENCOUNTER — Other Ambulatory Visit: Payer: Self-pay | Admitting: Orthopedic Surgery

## 2023-09-06 DIAGNOSIS — I10 Essential (primary) hypertension: Secondary | ICD-10-CM

## 2023-09-10 ENCOUNTER — Telehealth: Payer: Self-pay | Admitting: Orthopaedic Surgery

## 2023-09-10 NOTE — Telephone Encounter (Signed)
 Patient's daughter called. Mom would like gel injections.

## 2023-09-12 ENCOUNTER — Other Ambulatory Visit: Payer: Self-pay | Admitting: Orthopedic Surgery

## 2023-09-12 DIAGNOSIS — M25473 Effusion, unspecified ankle: Secondary | ICD-10-CM

## 2023-09-20 ENCOUNTER — Telehealth: Payer: Self-pay | Admitting: Orthopaedic Surgery

## 2023-09-20 ENCOUNTER — Encounter: Payer: Self-pay | Admitting: Orthopedic Surgery

## 2023-09-20 ENCOUNTER — Ambulatory Visit (INDEPENDENT_AMBULATORY_CARE_PROVIDER_SITE_OTHER): Admitting: Orthopedic Surgery

## 2023-09-20 VITALS — BP 122/78 | HR 61 | Temp 97.5°F | Resp 18 | Ht 62.0 in | Wt 142.8 lb

## 2023-09-20 DIAGNOSIS — H6122 Impacted cerumen, left ear: Secondary | ICD-10-CM

## 2023-09-20 DIAGNOSIS — M85852 Other specified disorders of bone density and structure, left thigh: Secondary | ICD-10-CM | POA: Diagnosis not present

## 2023-09-20 DIAGNOSIS — R634 Abnormal weight loss: Secondary | ICD-10-CM

## 2023-09-20 DIAGNOSIS — F01B3 Vascular dementia, moderate, with mood disturbance: Secondary | ICD-10-CM

## 2023-09-20 DIAGNOSIS — I10 Essential (primary) hypertension: Secondary | ICD-10-CM

## 2023-09-20 DIAGNOSIS — D696 Thrombocytopenia, unspecified: Secondary | ICD-10-CM

## 2023-09-20 MED ORDER — MEMANTINE HCL 5 MG PO TABS: 5.0000 mg | ORAL_TABLET | Freq: Every day | ORAL | 1 refills | Status: DC

## 2023-09-20 NOTE — Telephone Encounter (Signed)
 Talked with patients daughter [name] and advised her that patient is approved to have gel injection.  Voiced that she understands.

## 2023-09-20 NOTE — Telephone Encounter (Signed)
VOB has been submitted for Monovisc, right knee.

## 2023-09-20 NOTE — Progress Notes (Signed)
 Careteam: Patient Care Team: Arnetha Bhat, NP as PCP - General (Adult Health Nurse Practitioner)  Seen by: Ulyses Gandy, AGNP-C  PLACE OF SERVICE:  [facility]  Advanced Directive information    No Known Allergies  Chief Complaint  Patient presents with   Medical Management of Chronic Issues    Check up, last seen 04/2023. In addition needs to discuss Medicare Annual Wellness Visit Covid, Shingrix and DTAP vaccine.      HPI: Patient is a 83 y.o. female seen today foe medical management of chronic conditions.   Daughter present for encounter.   Discussed the use of AI scribe software for clinical note transcription with the patient, who gave verbal consent to proceed.  History of Present Illness    Melissa Fletcher is an 83 year old female with vascular dementia who presents for a follow-up visit. She is accompanied by her daughter, who assists with communication and care.  She resides in independent living at [facility]. Continues to do her ADLs on her own. She has not experienced any falls or accidents. She is social with other on campus. Family has tried to take her placed but she is less motivated to go. She has increased anxiety, obsessive thoughts and paranoia per family. No agitation. We have tried SSRI in past but she would stop taking. She is in agreeable to try Namenda  today. MMSE 11/30 09/21/2022.   Followed by Dr. Liam Redhead due to thrombocytopenia. 04/01 bone marrow aspiration was negative for leukemia, lymphoma or dysplasia.   She has lost 13 lbs since our last encounter.   She has a history of osteopenia, diagnosed three years ago. She takes a vitamin supplement for bone health, although she is unsure of the specific type. Upset when I recommended future DEXA. She reports " I am not doing anymore tests."   H/o cerumen impaction. Using Debrox for past 5 days. Requesting ear lavage. Wears hearing aids as well.   Blood pressure controlled with atenolol  and  valsartan .      Review of Systems:  Review of Systems  Unable to perform ROS: Dementia    Past Medical History:  Diagnosis Date   Arthritis    Hepatitis B    Hypertension    Past Surgical History:  Procedure Laterality Date   CHOLECYSTECTOMY     TOTAL HIP ARTHROPLASTY Right    Social History:   reports that she quit smoking about 25 years ago. Her smoking use included cigarettes. She started smoking about 65 years ago. She has a 20 pack-year smoking history. She has never used smokeless tobacco. She reports that she does not drink alcohol and does not use drugs.  Family History  Problem Relation Age of Onset   Stroke Mother    Cancer Father        Brain   Healthy Daughter    Healthy Daughter    Healthy Son     Medications: Patient's Medications  New Prescriptions   No medications on file  Previous Medications   ATENOLOL  (TENORMIN ) 50 MG TABLET    TAKE 1 TABLET(50 MG) BY MOUTH DAILY   CHOLECALCIFEROL (VITAMIN D3 PO)    Take 10 mg by mouth.   FUROSEMIDE  (LASIX ) 20 MG TABLET    TAKE 1 TABLET(20 MG) BY MOUTH DAILY AS NEEDED FOR ANKLE SWELLING   POTASSIUM CHLORIDE  (KLOR-CON ) 10 MEQ TABLET    TAKE 1 TABLET(10 MEQ) BY MOUTH DAILY WITH FUROSEMIDE  AS NEEDED   TRAMADOL  (ULTRAM ) 50 MG  TABLET    TAKE 1 TABLET(50 MG) BY MOUTH TWICE DAILY AS NEEDED   VALSARTAN  (DIOVAN ) 160 MG TABLET    TAKE 1 TABLET(160 MG) BY MOUTH DAILY  Modified Medications   No medications on file  Discontinued Medications   No medications on file    Physical Exam:  Vitals:   09/20/23 0919  BP: 122/78  Pulse: (!) 48  Resp: 18  Temp: (!) 97.5 F (36.4 C)  SpO2: 97%  Weight: 142 lb 12.8 oz (64.8 kg)  Height: 5\' 2"  (1.575 m)   Body mass index is 26.12 kg/m. Wt Readings from Last 3 Encounters:  09/20/23 142 lb 12.8 oz (64.8 kg)  08/30/23 142 lb 8 oz (64.6 kg)  08/21/23 143 lb (64.9 kg)    Physical Exam Vitals reviewed.  Constitutional:      General: She is not in acute distress. HENT:      Head: Normocephalic.     Right Ear: There is no impacted cerumen.     Left Ear: There is impacted cerumen.     Nose: Nose normal.     Mouth/Throat:     Mouth: Mucous membranes are moist.  Eyes:     General:        Right eye: No discharge.        Left eye: No discharge.  Cardiovascular:     Rate and Rhythm: Normal rate and regular rhythm.     Pulses: Normal pulses.     Heart sounds: Normal heart sounds.  Pulmonary:     Effort: Pulmonary effort is normal.     Breath sounds: Normal breath sounds.  Abdominal:     General: Bowel sounds are normal.     Palpations: Abdomen is soft.  Musculoskeletal:     Cervical back: Neck supple.     Right lower leg: No edema.     Left lower leg: No edema.  Skin:    General: Skin is warm.     Capillary Refill: Capillary refill takes less than 2 seconds.  Neurological:     General: No focal deficit present.     Mental Status: She is alert and oriented to person, place, and time.  Psychiatric:        Mood and Affect: Mood normal.     Labs reviewed: Basic Metabolic Panel: Recent Labs    09/21/22 0948 10/24/22 2002 06/27/23 1241 06/27/23 1242  NA 140 140 138  --   K 4.0 3.9 4.1  --   CL 105 105 104  --   CO2 29 30 32  --   GLUCOSE 91 85 86  --   BUN 15 13 18   --   CREATININE 0.74 0.63 0.77  --   CALCIUM  10.3 10.4* 10.5*  --   MG  --  1.7  --   --   TSH  --   --   --  0.906   Liver Function Tests: Recent Labs    09/21/22 0948 10/24/22 2002 06/27/23 1241  AST 29 33 29  ALT 16 15 13   ALKPHOS  --  99 90  BILITOT 0.7 0.6 0.8  PROT 7.7 7.7 7.6  ALBUMIN  --  3.8 3.7   No results for input(s): "LIPASE", "AMYLASE" in the last 8760 hours. No results for input(s): "AMMONIA" in the last 8760 hours. CBC: Recent Labs    07/25/23 0743 08/21/23 0730 08/30/23 0816  WBC 4.2 3.4* 4.5  NEUTROABS 1.9 1.7 2.3  HGB 10.9* 10.9* 11.8*  HCT 32.1* 29.8* 33.8*  MCV 96.1 105.7* 97.1  PLT 75* 82* 93*   Lipid Panel: No results for input(s):  "CHOL", "HDL", "LDLCALC", "TRIG", "CHOLHDL", "LDLDIRECT" in the last 8760 hours. TSH: Recent Labs    06/27/23 1242  TSH 0.906   A1C: No results found for: "HGBA1C"   Assessment/Plan 1. 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  Dispense: 90 tablet; Refill: 1  2. Osteopenia of neck of femur - t score -2.1 left femur neck  - discussed starting Caltrate  - recommend DEXA 2026  3. Impacted cerumen of left ear - debrox completed x 5 days - Ear Lavage> successful  4. Thrombocytopenia (HCC) - followed by Dr. Liam Redhead - recent bone marrow biopsy unremarkable - blood counts stable - f/u in 6 months  5. Weight loss - weight down 13 lbs - BMI 26.12 - if weight continues to trend down, recommend meal supplement shakes  6. Essential hypertension - controlled - BUN/creat 18/0.77 06/27/2023 - cont atenolol  and valsartan   Total time: 31 minutes. Greater than 50% of total time spent doing patient education regarding dementia, falls safety, thrombocytopenia, HTN and health maintenance including symptom/medication management.     Next appt: 02/21/2024  Ulyses Gandy, Melissa Fletcher  [facility] [phone]

## 2023-09-20 NOTE — Patient Instructions (Signed)
 Recommend Caltrate for bone health   Please start Namenda  for memory health

## 2023-09-20 NOTE — Telephone Encounter (Signed)
 Pt called about update on gel injection. Pt's daughter went ahead an had an appt for cortisone for right knee. Please call pt's daughter [name] about this matter. [name] number is [phone].

## 2023-09-25 ENCOUNTER — Other Ambulatory Visit: Payer: Self-pay

## 2023-09-25 DIAGNOSIS — M1711 Unilateral primary osteoarthritis, right knee: Secondary | ICD-10-CM

## 2023-09-28 ENCOUNTER — Ambulatory Visit (INDEPENDENT_AMBULATORY_CARE_PROVIDER_SITE_OTHER): Admitting: Orthopaedic Surgery

## 2023-09-28 ENCOUNTER — Encounter: Payer: Self-pay | Admitting: Orthopaedic Surgery

## 2023-09-28 DIAGNOSIS — M1711 Unilateral primary osteoarthritis, right knee: Secondary | ICD-10-CM

## 2023-09-28 MED ORDER — HYALURONAN 88 MG/4ML IX SOSY
88.0000 mg | PREFILLED_SYRINGE | INTRA_ARTICULAR | Status: AC | PRN
  Administered 2023-09-28: 88 mg via INTRA_ARTICULAR

## 2023-09-28 NOTE — Progress Notes (Signed)
   Procedure Note  Patient: Melissa Fletcher             Date of Birth: Oct 27, 1940           MRN: [id number]             Visit Date: 09/28/2023  Procedures: Visit Diagnoses:  1. Primary osteoarthritis of right knee     Large Joint Inj: R knee on 09/28/2023 8:20 AM Indications: pain Details: 22 G needle  Arthrogram: No  Medications: 88 mg Hyaluronan 88 MG/4ML Outcome: tolerated well, no immediate complications Patient was prepped and draped in the usual sterile fashion.

## 2023-10-05 ENCOUNTER — Other Ambulatory Visit: Payer: Self-pay | Admitting: Orthopedic Surgery

## 2023-10-05 DIAGNOSIS — M25473 Effusion, unspecified ankle: Secondary | ICD-10-CM

## 2023-10-10 ENCOUNTER — Other Ambulatory Visit: Payer: Self-pay | Admitting: Orthopedic Surgery

## 2023-10-10 DIAGNOSIS — G8929 Other chronic pain: Secondary | ICD-10-CM

## 2023-11-15 ENCOUNTER — Other Ambulatory Visit: Payer: Self-pay | Admitting: Orthopedic Surgery

## 2023-11-15 DIAGNOSIS — I10 Essential (primary) hypertension: Secondary | ICD-10-CM

## 2023-11-21 ENCOUNTER — Other Ambulatory Visit: Payer: Self-pay | Admitting: Orthopedic Surgery

## 2023-11-21 DIAGNOSIS — G8929 Other chronic pain: Secondary | ICD-10-CM

## 2023-11-22 NOTE — Telephone Encounter (Signed)
 Patient is requesting a refill of the following medications: Requested Prescriptions   Pending Prescriptions Disp Refills   traMADol  (ULTRAM ) 50 MG tablet [Pharmacy Med Name: TRAMADOL  50MG  TABLETS] 60 tablet     Sig: TAKE 1 TABLET(50 MG) BY MOUTH TWICE DAILY AS NEEDED    Date of last refill: 10/10/2023  Refill amount: 0  Treatment agreement date: Need to update at upcoming appointment 02/21/2024

## 2023-12-13 ENCOUNTER — Ambulatory Visit (INDEPENDENT_AMBULATORY_CARE_PROVIDER_SITE_OTHER): Admitting: Orthopedic Surgery

## 2023-12-13 ENCOUNTER — Encounter: Payer: Self-pay | Admitting: Orthopedic Surgery

## 2023-12-13 VITALS — BP 130/70 | HR 67 | Temp 97.7°F | Resp 16 | Ht 62.0 in | Wt 140.4 lb

## 2023-12-13 DIAGNOSIS — F03B18 Unspecified dementia, moderate, with other behavioral disturbance: Secondary | ICD-10-CM | POA: Diagnosis not present

## 2023-12-13 DIAGNOSIS — H6122 Impacted cerumen, left ear: Secondary | ICD-10-CM

## 2023-12-13 DIAGNOSIS — E441 Mild protein-calorie malnutrition: Secondary | ICD-10-CM | POA: Diagnosis not present

## 2023-12-13 DIAGNOSIS — D61818 Other pancytopenia: Secondary | ICD-10-CM | POA: Diagnosis not present

## 2023-12-13 LAB — COMPREHENSIVE METABOLIC PANEL WITH GFR
AG Ratio: 0.9 (calc) — ABNORMAL LOW (ref 1.0–2.5)
ALT: 13 U/L (ref 6–29)
AST: 30 U/L (ref 10–35)
Albumin: 3.4 g/dL — ABNORMAL LOW (ref 3.6–5.1)
Alkaline phosphatase (APISO): 94 U/L (ref 37–153)
BUN: 16 mg/dL (ref 7–25)
CO2: 31 mmol/L (ref 20–32)
Calcium: 10.2 mg/dL (ref 8.6–10.4)
Chloride: 104 mmol/L (ref 98–110)
Creat: 0.79 mg/dL (ref 0.60–0.95)
Globulin: 3.8 g/dL — ABNORMAL HIGH (ref 1.9–3.7)
Glucose, Bld: 93 mg/dL (ref 65–99)
Potassium: 4.6 mmol/L (ref 3.5–5.3)
Sodium: 139 mmol/L (ref 135–146)
Total Bilirubin: 0.6 mg/dL (ref 0.2–1.2)
Total Protein: 7.2 g/dL (ref 6.1–8.1)
eGFR: 73 mL/min/1.73m2 (ref >=60)

## 2023-12-13 LAB — CBC WITH DIFFERENTIAL/PLATELET
Absolute Lymphocytes: 874 {cells}/uL (ref 850–3900)
Absolute Monocytes: 377 {cells}/uL (ref 200–950)
Basophils Absolute: 41 {cells}/uL (ref 0–200)
Basophils Relative: 1.2 %
Eosinophils Absolute: 228 {cells}/uL (ref 15–500)
Eosinophils Relative: 6.7 %
HCT: 34.8 % — ABNORMAL LOW (ref 35.0–45.0)
Hemoglobin: 11 g/dL — ABNORMAL LOW (ref 11.7–15.5)
MCH: 30.9 pg (ref 27.0–33.0)
MCHC: 31.6 g/dL — ABNORMAL LOW (ref 32.0–36.0)
MCV: 97.8 fL (ref 80.0–100.0)
MPV: 11.3 fL (ref 7.5–12.5)
Monocytes Relative: 11.1 %
Neutro Abs: 1880 {cells}/uL (ref 1500–7800)
Neutrophils Relative %: 55.3 %
Platelets: 84 Thousand/uL — ABNORMAL LOW (ref 140–400)
RBC: 3.56 Million/uL — ABNORMAL LOW (ref 3.80–5.10)
RDW: 11.8 % (ref 11.0–15.0)
Total Lymphocyte: 25.7 %
WBC: 3.4 Thousand/uL — ABNORMAL LOW (ref 3.8–10.8)

## 2023-12-13 MED ORDER — REXULTI 0.5 MG PO TABS: 0.5000 mg | ORAL_TABLET | Freq: Every day | ORAL | Status: AC

## 2023-12-13 MED ORDER — BREXPIPRAZOLE 1 MG PO TABS: 1.0000 mg | ORAL_TABLET | Freq: Every day | ORAL | Status: DC

## 2023-12-13 NOTE — Progress Notes (Unsigned)
 Careteam: Patient Care Team: Gil Greig BRAVO, NP as PCP - General (Adult Health Nurse Practitioner)  Seen by: Greig Gil, AGNP-C  PLACE OF SERVICE:  [facility]  Advanced Directive information Does Patient Have a Medical Advance Directive?: Yes, Type of Advance Directive: Living will, Does patient want to make changes to medical advance directive?: No - Patient declined  No Known Allergies  Chief Complaint  Patient presents with   Cerumen Impaction    Patient is requesting bilateral ear lavage.      HPI: Patient is a 83 y.o. female seen today for acute visit due to cerumen impaction and worsening memory.   H/o cerumen impaction. She has been using Debrox drops for last few days. Right ear with no wax buildup. Left ear requiring ear lavage today.   MMSE 11/30 09/21/2022. 09/20/2023 she was started on Namenda  for worsening memory and paranoia. Unsuccessful trial of Zoloft  in past due to noncompliance. Daughter reports she took Namenda  for awhile but abruptly stopped. Daughter concerned she needs to be in memory care unit. She is planning on discussing insurance options soon. [place] able [place]. Family checks on her weekly. No agitation.   Followed by Dr. Gatha for pancytopenia. Bone marrow biopsy negative for abnormality.   No recent falls.   Weight continues to decline. 159 lbs 04/06/2022, 149 lbs 09/21/2022, now 140 lbs.   Review of Systems:  Review of Systems  Unable to perform ROS: Dementia    Past Medical History:  Diagnosis Date   Arthritis    Hepatitis B    Hypertension    Past Surgical History:  Procedure Laterality Date   CHOLECYSTECTOMY     TOTAL HIP ARTHROPLASTY Right    Social History:   reports that she quit smoking about 25 years ago. Her smoking use included cigarettes. She started smoking about 65 years ago. She has a 20 pack-year smoking history. She has never used smokeless tobacco. She reports that she does not drink alcohol  and does not use drugs.  Family History  Problem Relation Age of Onset   Stroke Mother    Cancer Father        Brain   Healthy Daughter    Healthy Daughter    Healthy Son     Medications: Patient's Medications  New Prescriptions   No medications on file  Previous Medications   ATENOLOL  (TENORMIN ) 50 MG TABLET    TAKE 1 TABLET(50 MG) BY MOUTH DAILY   CHOLECALCIFEROL (VITAMIN D3 PO)    Take 10 mg by mouth.   FUROSEMIDE  (LASIX ) 20 MG TABLET    TAKE 1 TABLET(20 MG) BY MOUTH DAILY AS NEEDED FOR ANKLE SWELLING   MEMANTINE  (NAMENDA ) 5 MG TABLET    Take 1 tablet (5 mg total) by mouth daily.   POTASSIUM CHLORIDE  (KLOR-CON ) 10 MEQ TABLET    TAKE 1 TABLET(10 MEQ) BY MOUTH DAILY WITH FUROSEMIDE  AS NEEDED   TRAMADOL  (ULTRAM ) 50 MG TABLET    TAKE 1 TABLET(50 MG) BY MOUTH TWICE DAILY AS NEEDED   VALSARTAN  (DIOVAN ) 160 MG TABLET    TAKE 1 TABLET(160 MG) BY MOUTH DAILY  Modified Medications   No medications on file  Discontinued Medications   No medications on file    Physical Exam:  Vitals:   12/13/23 1035  BP: 130/70  Pulse: 67  Resp: 16  Temp: 97.7 F (36.5 C)  SpO2: 90%  Weight: 140 lb 6.4 oz (63.7 kg)  Height:  5' 2 (1.575 m)   Body mass index is 25.68 kg/m. Wt Readings from Last 3 Encounters:  12/13/23 140 lb 6.4 oz (63.7 kg)  09/20/23 142 lb 12.8 oz (64.8 kg)  08/30/23 142 lb 8 oz (64.6 kg)    Physical Exam Vitals reviewed.  Constitutional:      General: She is not in acute distress. HENT:     Head: Normocephalic.     Right Ear: There is no impacted cerumen.     Left Ear: There is impacted cerumen.     Ears:     Comments: HOH, bilateral hearing aids     Nose: Nose normal.     Mouth/Throat:     Mouth: Mucous membranes are moist.  Eyes:     General:        Right eye: No discharge.        Left eye: No discharge.  Cardiovascular:     Rate and Rhythm: Normal rate and regular rhythm.     Pulses: Normal pulses.     Heart sounds: Normal heart sounds.  Pulmonary:      Effort: Pulmonary effort is normal.     Breath sounds: Normal breath sounds.  Abdominal:     General: Bowel sounds are normal.     Palpations: Abdomen is soft.  Musculoskeletal:     Cervical back: Neck supple.     Right lower leg: No edema.     Left lower leg: No edema.  Skin:    General: Skin is warm.     Capillary Refill: Capillary refill takes less than 2 seconds.  Neurological:     General: No focal deficit present.     Mental Status: She is alert.     Gait: Gait abnormal.  Psychiatric:        Mood and Affect: Mood normal.     Labs reviewed: Basic Metabolic Panel: Recent Labs    06/27/23 1241 06/27/23 1242  NA 138  --   K 4.1  --   CL 104  --   CO2 32  --   GLUCOSE 86  --   BUN 18  --   CREATININE 0.77  --   CALCIUM  10.5*  --   TSH  --  0.906   Liver Function Tests: Recent Labs    06/27/23 1241  AST 29  ALT 13  ALKPHOS 90  BILITOT 0.8  PROT 7.6  ALBUMIN 3.7   No results for input(s): LIPASE, AMYLASE in the last 8760 hours. No results for input(s): AMMONIA in the last 8760 hours. CBC: Recent Labs    07/25/23 0743 08/21/23 0730 08/30/23 0816  WBC 4.2 3.4* 4.5  NEUTROABS 1.9 1.7 2.3  HGB 10.9* 10.9* 11.8*  HCT 32.1* 29.8* 33.8*  MCV 96.1 105.7* 97.1  PLT 75* 82* 93*   Lipid Panel: No results for input(s): CHOL, HDL, LDLCALC, TRIG, CHOLHDL, LDLDIRECT in the last 8760 hours. TSH: Recent Labs    06/27/23 1242  TSH 0.906   A1C: No results found for: HGBA1C   Assessment/Plan 1. Impacted cerumen of left ear (Primary)  - Ear Lavage  2. Moderate dementia with other behavioral disturbance, unspecified dementia type (HCC) - MMSE 11/30 09/2022 - continues to have mild paranoia and noncompliance with medications - able [place] - weights are trending down - ambulates on own - unsuccessful trial of Zoloft  and Namenda  due to noncompliance - will trial Rexulti > samples given in office - Brexpiprazole   (REXULTI ) 0.5  MG TABS; Take 1 tablet (0.5 mg total) by mouth daily for 7 days. - brexpiprazole  (REXULTI ) 1 MG TABS tablet; Take 1 tablet (1 mg total) by mouth daily. - CBC with Differential/Platelet - Comprehensive metabolic panel with GFR  3. Pancytopenia (HCC) - followed by hematology - 08/2023 bone marrow biopsy no abnormality  4. Mild protein-calorie malnutrition (HCC) - BMI 25.68 - weight down almost 20 lbs in 18 months - albumin 3.4 12/13/2023 - protein 7.2 12/13/2023  Total time: 37 minutes. Greater than 50% of total time spent doing patient education regarding dementia with behaviors, pancytopenia, weight loss and ear wax treatment including symptom/medication management.     Next appt: 02/21/2024  Greig Cluster, ELNITA  [facility] [phone]

## 2023-12-14 ENCOUNTER — Ambulatory Visit: Payer: Self-pay | Admitting: Orthopedic Surgery

## 2024-01-02 ENCOUNTER — Other Ambulatory Visit: Payer: Self-pay | Admitting: Orthopedic Surgery

## 2024-01-02 DIAGNOSIS — G8929 Other chronic pain: Secondary | ICD-10-CM

## 2024-01-02 NOTE — Telephone Encounter (Signed)
 Pharmacy Requested refill.  Epic LR: 11/22/2023 Contract Date: 06/02/2021 Note added to upcoming appointment to update contract.   Pended Rx and sent to Greig Cluster, NP for approval.

## 2024-01-14 ENCOUNTER — Encounter (HOSPITAL_COMMUNITY): Payer: Self-pay

## 2024-01-14 ENCOUNTER — Emergency Department (HOSPITAL_COMMUNITY)

## 2024-01-14 ENCOUNTER — Emergency Department (HOSPITAL_COMMUNITY)
Admission: EM | Admit: 2024-01-14 | Discharge: 2024-01-15 | Disposition: A | Discharge status: Home / Self Care | Attending: Emergency Medicine | Admitting: Emergency Medicine

## 2024-01-14 ENCOUNTER — Other Ambulatory Visit: Payer: Self-pay

## 2024-01-14 DIAGNOSIS — I10 Essential (primary) hypertension: Secondary | ICD-10-CM | POA: Insufficient documentation

## 2024-01-14 DIAGNOSIS — Z79899 Other long term (current) drug therapy: Secondary | ICD-10-CM | POA: Insufficient documentation

## 2024-01-14 DIAGNOSIS — R29810 Facial weakness: Secondary | ICD-10-CM | POA: Diagnosis present

## 2024-01-14 DIAGNOSIS — R791 Abnormal coagulation profile: Secondary | ICD-10-CM | POA: Insufficient documentation

## 2024-01-14 DIAGNOSIS — G510 Bell's palsy: Secondary | ICD-10-CM | POA: Insufficient documentation

## 2024-01-14 LAB — DIFFERENTIAL
Abs Immature Granulocytes: 0.01 K/uL (ref 0.00–0.07)
Basophils Absolute: 0 K/uL (ref 0.0–0.1)
Basophils Relative: 1 %
Eosinophils Absolute: 0.2 K/uL (ref 0.0–0.5)
Eosinophils Relative: 5 %
Immature Granulocytes: 0 %
Lymphocytes Relative: 30 %
Lymphs Abs: 1.4 K/uL (ref 0.7–4.0)
Monocytes Absolute: 0.5 K/uL (ref 0.1–1.0)
Monocytes Relative: 11 %
Neutro Abs: 2.5 K/uL (ref 1.7–7.7)
Neutrophils Relative %: 53 %

## 2024-01-14 LAB — I-STAT CHEM 8, ED
BUN: 15 mg/dL (ref 8–23)
Calcium, Ion: 1.36 mmol/L (ref 1.15–1.40)
Chloride: 102 mmol/L (ref 98–111)
Creatinine, Ser: 0.9 mg/dL (ref 0.44–1.00)
Glucose, Bld: 140 mg/dL — ABNORMAL HIGH (ref 70–99)
HCT: 32 % — ABNORMAL LOW (ref 36.0–46.0)
Hemoglobin: 10.9 g/dL — ABNORMAL LOW (ref 12.0–15.0)
Potassium: 3.4 mmol/L — ABNORMAL LOW (ref 3.5–5.1)
Sodium: 142 mmol/L (ref 135–145)
TCO2: 28 mmol/L (ref 22–32)

## 2024-01-14 LAB — COMPREHENSIVE METABOLIC PANEL WITH GFR
ALT: 21 U/L (ref 0–44)
AST: 43 U/L — ABNORMAL HIGH (ref 15–41)
Albumin: 3 g/dL — ABNORMAL LOW (ref 3.5–5.0)
Alkaline Phosphatase: 82 U/L (ref 38–126)
Anion gap: 8 (ref 5–15)
BUN: 14 mg/dL (ref 8–23)
CO2: 27 mmol/L (ref 22–32)
Calcium: 10.2 mg/dL (ref 8.9–10.3)
Chloride: 103 mmol/L (ref 98–111)
Creatinine, Ser: 0.99 mg/dL (ref 0.44–1.00)
GFR, Estimated: 56 mL/min — ABNORMAL LOW (ref >60)
Glucose, Bld: 139 mg/dL — ABNORMAL HIGH (ref 70–99)
Potassium: 3.3 mmol/L — ABNORMAL LOW (ref 3.5–5.1)
Sodium: 138 mmol/L (ref 135–145)
Total Bilirubin: 1 mg/dL (ref 0.0–1.2)
Total Protein: 7.2 g/dL (ref 6.5–8.1)

## 2024-01-14 LAB — CBC
HCT: 36.5 % (ref 36.0–46.0)
Hemoglobin: 12 g/dL (ref 12.0–15.0)
MCH: 31.1 pg (ref 26.0–34.0)
MCHC: 32.9 g/dL (ref 30.0–36.0)
MCV: 94.6 fL (ref 80.0–100.0)
Platelets: 94 K/uL — ABNORMAL LOW (ref 150–400)
RBC: 3.86 MIL/uL — ABNORMAL LOW (ref 3.87–5.11)
RDW: 12.8 % (ref 11.5–15.5)
WBC: 5 K/uL (ref 4.0–10.5)
nRBC: 0 % (ref 0.0–0.2)

## 2024-01-14 LAB — URINALYSIS, ROUTINE W REFLEX MICROSCOPIC
Bacteria, UA: NONE SEEN
Bilirubin Urine: NEGATIVE
Glucose, UA: NEGATIVE mg/dL
Hgb urine dipstick: NEGATIVE
Ketones, ur: NEGATIVE mg/dL
Nitrite: NEGATIVE
Protein, ur: 100 mg/dL — AB
Specific Gravity, Urine: 1.029 (ref 1.005–1.030)
WBC, UA: >50 WBC/hpf (ref 0–5)
pH: 5 (ref 5.0–8.0)

## 2024-01-14 LAB — PROTIME-INR
INR: 1.2 (ref 0.8–1.2)
Prothrombin Time: 15.7 s — ABNORMAL HIGH (ref 11.4–15.2)

## 2024-01-14 LAB — APTT: aPTT: 32 s (ref 24–36)

## 2024-01-14 LAB — ETHANOL: Alcohol, Ethyl (B): <15 mg/dL (ref <15)

## 2024-01-14 MED ORDER — SODIUM CHLORIDE 0.9% FLUSH
3.0000 mL | Freq: Once | INTRAVENOUS | Status: AC
  Administered 2024-01-14: 3 mL via INTRAVENOUS

## 2024-01-14 NOTE — ED Triage Notes (Addendum)
 Pt arrived from home via [facility] with daughter. Per daughter pt noted to have new left facial droop, new slurred speech and drooling from left side. Pt unable to ambulate self at present. LKW 7 or 8 days ago NIH 2

## 2024-01-14 NOTE — ED Provider Notes (Signed)
 [place] EMERGENCY DEPARTMENT AT [facility] Provider Note   CSN: [id number] Arrival date & time: 01/14/24  8091     Patient presents with: Weakness and Facial Droop   Melissa Fletcher is a 83 y.o. female.  Patient with past medical history significant for hypertension, bilateral hearing loss, thrombocytopenia, right THA, cholecystectomy, currently living in independent living presents to the emergency department accompanied by her daughter with concerns over left-sided facial droop, slurred speech, drooling from the left side of her mouth.  Patient is also having difficulty with ambulation at the current time.  The patient was last known well approximately 7 or 8 days ago when the patient's other daughter visited her at the independent living facility last Saturday or Sunday.  The patient is alert and oriented to person and place and only complains of some tenderness to the top of her head where she states she fell when out with the other daughter last weekend.  The patient's other daughter denies any falls during that excursion.  The patient denies shortness of breath, chest pain, headache, abdominal pain, nausea, vomiting.      Weakness      Prior to Admission medications   Medication Sig Start Date End Date Taking? Authorizing Provider  predniSONE  (DELTASONE ) 10 MG tablet Take 6 tablets (60 mg total) by mouth daily for 5 days. 01/15/24 01/20/24 Yes Susano Cleckler, Ubaldo NOVAK, PA-C  atenolol  (TENORMIN ) 50 MG tablet TAKE 1 TABLET(50 MG) BY MOUTH DAILY 11/15/23   Fargo, [name] E, NP  brexpiprazole  (REXULTI ) 1 MG TABS tablet Take 1 tablet (1 mg total) by mouth daily. 12/21/23   Fargo, [name] E, NP  Cholecalciferol (VITAMIN D3 PO) Take 10 mg by mouth.    [provider]  furosemide  (LASIX ) 20 MG tablet TAKE 1 TABLET(20 MG) BY MOUTH DAILY AS NEEDED FOR ANKLE SWELLING 09/12/23   Fargo, [name] E, NP  potassium chloride  (KLOR-CON ) 10 MEQ tablet TAKE 1 TABLET(10 MEQ) BY MOUTH DAILY WITH FUROSEMIDE   AS NEEDED 10/05/23   Fargo, [name] E, NP  traMADol  (ULTRAM ) 50 MG tablet TAKE 1 TABLET(50 MG) BY MOUTH TWICE DAILY AS NEEDED 01/02/24   Fargo, [name] E, NP  valsartan  (DIOVAN ) 160 MG tablet TAKE 1 TABLET(160 MG) BY MOUTH DAILY 09/06/23   Gil Greig BRAVO, NP    Allergies: Patient has no known allergies.    Review of Systems  Neurological:  Positive for weakness.    Updated Vital Signs BP (!) 167/87   Pulse 61   Temp 98.2 F (36.8 C) (Oral)   Resp 17   Ht 5' 3 (1.6 m)   Wt 63.5 kg   LMP 07/27/1992   SpO2 97%   BMI 24.80 kg/m   Physical Exam Vitals and nursing note reviewed.  Constitutional:      General: She is not in acute distress.    Appearance: She is well-developed.  HENT:     Head: Normocephalic and atraumatic.  Eyes:     Conjunctiva/sclera: Conjunctivae normal.  Cardiovascular:     Rate and Rhythm: Normal rate and regular rhythm.     Heart sounds: No murmur heard. Pulmonary:     Effort: Pulmonary effort is normal. No respiratory distress.     Breath sounds: Normal breath sounds.  Abdominal:     Palpations: Abdomen is soft.     Tenderness: There is no abdominal tenderness.  Musculoskeletal:        General: No swelling.     Cervical back: Neck supple.  Skin:    General: Skin is warm and dry.     Capillary Refill: Capillary refill takes less than 2 seconds.  Neurological:     Mental Status: She is alert.     Comments: Patient with left-sided facial droop, slurred speech per daughter.  Grossly equal strength and sensation in bilateral upper and lower extremities.  Psychiatric:        Mood and Affect: Mood normal.     (all labs ordered are listed, but only abnormal results are displayed) Labs Reviewed  PROTIME-INR - Abnormal; Notable for the following components:      Result Value   Prothrombin Time 15.7 (*)    All other components within normal limits  CBC - Abnormal; Notable for the following components:   RBC 3.86 (*)    Platelets 94 (*)    All other  components within normal limits  COMPREHENSIVE METABOLIC PANEL WITH GFR - Abnormal; Notable for the following components:   Potassium 3.3 (*)    Glucose, Bld 139 (*)    Albumin 3.0 (*)    AST 43 (*)    GFR, Estimated 56 (*)    All other components within normal limits  URINALYSIS, ROUTINE W REFLEX MICROSCOPIC - Abnormal; Notable for the following components:   Color, Urine AMBER (*)    APPearance CLOUDY (*)    Protein, ur 100 (*)    Leukocytes,Ua LARGE (*)    Non Squamous Epithelial 0-5 (*)    All other components within normal limits  I-STAT CHEM 8, ED - Abnormal; Notable for the following components:   Potassium 3.4 (*)    Glucose, Bld 140 (*)    Hemoglobin 10.9 (*)    HCT 32.0 (*)    All other components within normal limits  CBG MONITORING, ED - Abnormal; Notable for the following components:   Glucose-Capillary 102 (*)    All other components within normal limits  APTT  DIFFERENTIAL  ETHANOL    EKG: EKG Interpretation Date/Time:  Tuesday January 15 2024 04:48:09 EDT Ventricular Rate:  59 PR Interval:  164 QRS Duration:  136 QT Interval:  470 QTC Calculation: 466 R Axis:   54  Text Interpretation: Unknown rhythm, irregular rate Right bundle branch block Confirmed by Nettie, April ([id number]) on 01/15/2024 4:50:46 AM  Radiology: MR BRAIN WO CONTRAST Result Date: 01/15/2024 CLINICAL DATA:  Initial evaluation for acute neuro deficit, stroke suspected. EXAM: MRI HEAD WITHOUT CONTRAST TECHNIQUE: Multiplanar, multiecho pulse sequences of the brain and surrounding structures were obtained without intravenous contrast. COMPARISON:  CT from 01/14/2024 FINDINGS: Brain: Diffuse prominence of the CSF containing spaces compatible generalized cerebral atrophy. Patchy and confluent T2/FLAIR hyperintensity involving the periventricular and deep white matter both cerebral hemispheres as well as the pons, consistent with chronic small vessel ischemic disease, moderate to advanced in nature.  No evidence for acute or subacute infarct. Gray-white matter differentiation maintained. No areas of chronic cortical infarction. No acute intracranial hemorrhage. Few small chronic micro hemorrhages noted, likely hypertensive in nature. No mass lesion, midline shift or mass effect. No hydrocephalus or extra-axial fluid collection. Pituitary gland within normal limits. Vascular: Major intracranial vascular flow voids are maintained. Skull and upper cervical spine: Craniocervical junction within normal limits. Bone marrow signal intensity normal. No scalp soft tissue abnormality. Sinuses/Orbits: Prior bilateral ocular lens replacement. Mild scattered mucosal thickening noted about the frontoethmoidal and sphenoid sinuses. No significant mastoid effusion. Other: None. IMPRESSION: 1. No acute intracranial abnormality. 2. Generalized cerebral atrophy with moderate to  advanced chronic microvascular ischemic disease. Electronically Signed   By: Morene Hoard M.D.   On: 01/15/2024 04:03   CT HEAD WO CONTRAST Result Date: 01/14/2024 CLINICAL DATA:  Neuro deficit, acute, stroke suspected . Pt arrived from home via Pov with daughter. Per daughter pt noted to have new left facial droop, new slurred speech and drooling from left side. EXAM: CT HEAD WITHOUT CONTRAST TECHNIQUE: Contiguous axial images were obtained from the base of the skull through the vertex without intravenous contrast. RADIATION DOSE REDUCTION: This exam was performed according to the departmental dose-optimization program which includes automated exposure control, adjustment of the mA and/or kV according to patient size and/or use of iterative reconstruction technique. COMPARISON:  CT angio head 06/08/2021 FINDINGS: Brain: Cerebral ventricle sizes are concordant with the degree of cerebral volume loss. Patchy and confluent areas of decreased attenuation are noted throughout the deep and periventricular white matter of the cerebral hemispheres  bilaterally, compatible with chronic microvascular ischemic disease. No evidence of large-territorial acute infarction. No parenchymal hemorrhage. No mass lesion. No extra-axial collection. No mass effect or midline shift. No hydrocephalus. Basilar cisterns are patent. Vascular: No hyperdense vessel. Atherosclerotic calcifications are present within the cavernous internal carotid and vertebral arteries. Skull: No acute fracture or focal lesion. Sinuses/Orbits: Bilateral frontal sinus mucosal thickening. Otherwise paranasal sinuses and mastoid air cells are clear. Bilateral lens replacement. Otherwise the orbits are unremarkable. Other: None. IMPRESSION: No acute intracranial abnormality. Electronically Signed   By: Morgane  Naveau M.D.   On: 01/14/2024 21:32   DG Chest 2 View Result Date: 01/14/2024 CLINICAL DATA:  sob EXAM: CHEST - 2 VIEW COMPARISON:  Chest x-ray 09/23/2020 FINDINGS: The heart and mediastinal contours are unchanged. Atherosclerotic plaque. No focal consolidation. No pulmonary edema. No pleural effusion. No pneumothorax. No acute osseous abnormality. IMPRESSION: 1. No active cardiopulmonary disease. 2.  Aortic Atherosclerosis (ICD10-I70.0). Electronically Signed   By: Morgane  Naveau M.D.   On: 01/14/2024 20:55     Procedures   Medications Ordered in the ED  sodium chloride  flush (NS) 0.9 % injection 3 mL (3 mLs Intravenous Given 01/14/24 2303)                                    Medical Decision Making Amount and/or Complexity of Data Reviewed Labs: ordered. Radiology: ordered.   This patient presents to the ED for concern of facial droop and weakness, this involves an extensive number of treatment options, and is a complaint that carries with it a high risk of complications and morbidity.  The differential diagnosis includes TIA, stroke, Bell's palsy, deconditioning, infection, others   Co morbidities / Chronic conditions that complicate the patient evaluation  History of  hypertension, arthritis   Additional history obtained:  Additional history obtained from EMR   Lab Tests:  I Ordered, and personally interpreted labs.  The pertinent results include: UA with leukocytes but no bacteria, nitrate negative, normal sodium, grossly normal glucose, no anemia   Imaging Studies ordered:  I ordered imaging studies including chest x-ray, CTA without contrast, MR brain without contrast I independently visualized and interpreted imaging which showed no acute findings on chest x-ray or head CT, MRI shows 1. No acute intracranial abnormality.  2. Generalized cerebral atrophy with moderate to advanced chronic  microvascular ischemic disease.   I agree with the radiologist interpretation   Cardiac Monitoring: / EKG:  The patient had an irregular rhythm  on EKG, rate controlled   Consultations Obtained:  I requested consultation with the neurologist, Dr.Khaliqdina,  and discussed lab and imaging findings as well as pertinent plan - they recommend: consider possible Bell's palsy, if unclear about upper face involvement consider MRI. If MRI negative can likely treat for Bell's palsy   Social Determinants of Health:  Patient is a former smoker   Test / Admission - Considered:  Patient with no acute findings on imaging.  She was ambulated and briefly desatted to 89% but quickly returned to a saturation of 94+ percent on room air.  She was not winded while walking.  At this time the facial droop seems most consistent with a Bell's palsy.  Plan to treat with short course of oral steroids and have patient follow-up with primary care.  Return precautions have been provided.      Final diagnoses:  Bell's palsy    ED Discharge Orders          Ordered    predniSONE  (DELTASONE ) 10 MG tablet  Daily        01/15/24 0524               Logan Ubaldo NOVAK, PA-C 01/15/24 0530    Long, Joshua G, MD 01/15/24 [id number]

## 2024-01-14 NOTE — ED Notes (Signed)
 Called an placed PT on monitor with CCMD

## 2024-01-14 NOTE — ED Provider Notes (Incomplete)
 [place] EMERGENCY DEPARTMENT AT [facility] Provider Note   CSN: [id number] Arrival date & time: 01/14/24  8091     Patient presents with: Weakness and Facial Droop   Melissa Fletcher is a 83 y.o. female.  Patient with past medical history significant for hypertension, bilateral hearing loss, thrombocytopenia, right THA, cholecystectomy, currently living in independent living presents to the emergency department accompanied by her daughter with concerns over left-sided facial droop, slurred speech, drooling from the left side of her mouth.  Patient is also having difficulty with ambulation at the current time.  The patient was last known well approximately 7 or 8 days ago when the patient's other daughter visited her at the independent living facility last Saturday or Sunday.  The patient is alert and oriented to person and place and only complains of some tenderness to the top of her head where she states she fell when out with the other daughter last weekend.  The patient's other daughter denies any falls during that excursion.  The patient denies shortness of breath, chest pain, headache, abdominal pain, nausea, vomiting.    {Add pertinent medical, surgical, social history, OB history to HPI:32947}  Weakness      Prior to Admission medications   Medication Sig Start Date End Date Taking? Authorizing Provider  atenolol  (TENORMIN ) 50 MG tablet TAKE 1 TABLET(50 MG) BY MOUTH DAILY 11/15/23   Fargo, [name] E, NP  brexpiprazole  (REXULTI ) 1 MG TABS tablet Take 1 tablet (1 mg total) by mouth daily. 12/21/23   Fargo, [name] E, NP  Cholecalciferol (VITAMIN D3 PO) Take 10 mg by mouth.    [provider]  furosemide  (LASIX ) 20 MG tablet TAKE 1 TABLET(20 MG) BY MOUTH DAILY AS NEEDED FOR ANKLE SWELLING 09/12/23   Fargo, [name] E, NP  potassium chloride  (KLOR-CON ) 10 MEQ tablet TAKE 1 TABLET(10 MEQ) BY MOUTH DAILY WITH FUROSEMIDE  AS NEEDED 10/05/23   Fargo, [name] E, NP  traMADol  (ULTRAM ) 50 MG  tablet TAKE 1 TABLET(50 MG) BY MOUTH TWICE DAILY AS NEEDED 01/02/24   Fargo, [name] E, NP  valsartan  (DIOVAN ) 160 MG tablet TAKE 1 TABLET(160 MG) BY MOUTH DAILY 09/06/23   Gil Greig BRAVO, NP    Allergies: Patient has no known allergies.    Review of Systems  Neurological:  Positive for weakness.    Updated Vital Signs BP (!) 189/82 (BP Location: Left Arm)   Pulse 91   Temp 98.3 F (36.8 C) (Oral)   Resp 19   Ht 5' 3 (1.6 m)   Wt 63.5 kg   LMP 07/27/1992   SpO2 100%   BMI 24.80 kg/m   Physical Exam Vitals and nursing note reviewed.  Constitutional:      General: She is not in acute distress.    Appearance: She is well-developed.  HENT:     Head: Normocephalic and atraumatic.  Eyes:     Conjunctiva/sclera: Conjunctivae normal.  Cardiovascular:     Rate and Rhythm: Normal rate and regular rhythm.     Heart sounds: No murmur heard. Pulmonary:     Effort: Pulmonary effort is normal. No respiratory distress.     Breath sounds: Normal breath sounds.  Abdominal:     Palpations: Abdomen is soft.     Tenderness: There is no abdominal tenderness.  Musculoskeletal:        General: No swelling.     Cervical back: Neck supple.  Skin:    General: Skin is warm and dry.  Capillary Refill: Capillary refill takes less than 2 seconds.  Neurological:     Mental Status: She is alert.     Comments: Patient with left-sided facial droop, slurred speech per daughter.  Grossly equal strength and sensation in bilateral upper and lower extremities.  Psychiatric:        Mood and Affect: Mood normal.     (all labs ordered are listed, but only abnormal results are displayed) Labs Reviewed  PROTIME-INR - Abnormal; Notable for the following components:      Result Value   Prothrombin Time 15.7 (*)    All other components within normal limits  CBC - Abnormal; Notable for the following components:   RBC 3.86 (*)    Platelets 94 (*)    All other components within normal limits  COMPREHENSIVE  METABOLIC PANEL WITH GFR - Abnormal; Notable for the following components:   Potassium 3.3 (*)    Glucose, Bld 139 (*)    Albumin 3.0 (*)    AST 43 (*)    GFR, Estimated 56 (*)    All other components within normal limits  URINALYSIS, ROUTINE W REFLEX MICROSCOPIC - Abnormal; Notable for the following components:   Color, Urine AMBER (*)    APPearance CLOUDY (*)    Protein, ur 100 (*)    Leukocytes,Ua LARGE (*)    Non Squamous Epithelial 0-5 (*)    All other components within normal limits  I-STAT CHEM 8, ED - Abnormal; Notable for the following components:   Potassium 3.4 (*)    Glucose, Bld 140 (*)    Hemoglobin 10.9 (*)    HCT 32.0 (*)    All other components within normal limits  APTT  DIFFERENTIAL  ETHANOL  CBG MONITORING, ED    EKG: None  Radiology: CT HEAD WO CONTRAST Result Date: 01/14/2024 CLINICAL DATA:  Neuro deficit, acute, stroke suspected . Pt arrived from home via Pov with daughter. Per daughter pt noted to have new left facial droop, new slurred speech and drooling from left side. EXAM: CT HEAD WITHOUT CONTRAST TECHNIQUE: Contiguous axial images were obtained from the base of the skull through the vertex without intravenous contrast. RADIATION DOSE REDUCTION: This exam was performed according to the departmental dose-optimization program which includes automated exposure control, adjustment of the mA and/or kV according to patient size and/or use of iterative reconstruction technique. COMPARISON:  CT angio head 06/08/2021 FINDINGS: Brain: Cerebral ventricle sizes are concordant with the degree of cerebral volume loss. Patchy and confluent areas of decreased attenuation are noted throughout the deep and periventricular white matter of the cerebral hemispheres bilaterally, compatible with chronic microvascular ischemic disease. No evidence of large-territorial acute infarction. No parenchymal hemorrhage. No mass lesion. No extra-axial collection. No mass effect or midline  shift. No hydrocephalus. Basilar cisterns are patent. Vascular: No hyperdense vessel. Atherosclerotic calcifications are present within the cavernous internal carotid and vertebral arteries. Skull: No acute fracture or focal lesion. Sinuses/Orbits: Bilateral frontal sinus mucosal thickening. Otherwise paranasal sinuses and mastoid air cells are clear. Bilateral lens replacement. Otherwise the orbits are unremarkable. Other: None. IMPRESSION: No acute intracranial abnormality. Electronically Signed   By: Morgane  Naveau M.D.   On: 01/14/2024 21:32   DG Chest 2 View Result Date: 01/14/2024 CLINICAL DATA:  sob EXAM: CHEST - 2 VIEW COMPARISON:  Chest x-ray 09/23/2020 FINDINGS: The heart and mediastinal contours are unchanged. Atherosclerotic plaque. No focal consolidation. No pulmonary edema. No pleural effusion. No pneumothorax. No acute osseous abnormality. IMPRESSION: 1. No  active cardiopulmonary disease. 2.  Aortic Atherosclerosis (ICD10-I70.0). Electronically Signed   By: Morgane  Naveau M.D.   On: 01/14/2024 20:55    {Document cardiac monitor, telemetry assessment procedure when appropriate:32947} Procedures   Medications Ordered in the ED  sodium chloride  flush (NS) 0.9 % injection 3 mL (has no administration in time range)      {Click here for ABCD2, HEART and other calculators REFRESH Note before signing:1}                              Medical Decision Making Amount and/or Complexity of Data Reviewed Labs: ordered. Radiology: ordered.   ***  {Document critical care time when appropriate  Document review of labs and clinical decision tools ie [id number], etc  Document your independent review of radiology images and any outside records  Document your discussion with family members, caretakers and with consultants  Document social determinants of health affecting pt's care  Document your decision making why or why not admission, treatments were needed:32947:::1}   Final diagnoses:   None    ED Discharge Orders     None

## 2024-01-14 NOTE — ED Notes (Signed)
 Pt placed on 2lpm to keep oxygen at 90 % or above

## 2024-01-14 NOTE — ED Provider Triage Note (Signed)
 Emergency Medicine Provider Triage Evaluation Note  Melissa Fletcher , a 83 y.o. female  was evaluated in triage.  Pt complains of confusion. Per daughter pt was last normal 8 days ago when she last saw her.  She visit her today and notice her speech was garble, her movement was off and her memory was off.  Pt was found to be hypoxic in triage.  She denies sob, and denies any new sxs.    Review of Systems  Positive: As above Negative: As above  Physical Exam  BP (!) 183/74 (BP Location: Left Arm)   Pulse 65   Temp 98.9 F (37.2 C)   Resp 16   LMP 07/27/1992   SpO2 (!) 88%  Gen:   Awake, no distress   Resp:  Normal effort  MSK:   Moves extremities without difficulty  Other:    Medical Decision Making  Medically screening exam initiated at 7:42 PM.  Appropriate orders placed.  [name] was informed that the remainder of the evaluation will be completed by another provider, this initial triage assessment does not replace that evaluation, and the importance of remaining in the ED until their evaluation is complete.  New oxygen requirement.  Also increase confusion.  Slight left side facial droop.     [name], PA-C 01/14/24 1944

## 2024-01-15 ENCOUNTER — Emergency Department (HOSPITAL_COMMUNITY)

## 2024-01-15 DIAGNOSIS — G510 Bell's palsy: Secondary | ICD-10-CM | POA: Diagnosis not present

## 2024-01-15 LAB — CBG MONITORING, ED: Glucose-Capillary: 102 mg/dL — ABNORMAL HIGH (ref 70–99)

## 2024-01-15 MED ORDER — PREDNISONE 10 MG PO TABS: 60.0000 mg | ORAL_TABLET | Freq: Every day | ORAL | 0 refills | Status: DC

## 2024-01-15 MED ORDER — PREDNISONE 10 MG PO TABS: 60.0000 mg | ORAL_TABLET | Freq: Every day | ORAL | 0 refills | Status: AC

## 2024-01-15 NOTE — ED Notes (Signed)
 Ambulated in the hallway on room air with 1+ assist due to using a walker at baseline. Pt sats dropped to 88% on room air but pt was not short of breath. Pt ambulated back to stretcher and EDP notified. Oxygen saturation immediately came back up to mid 90s.

## 2024-01-15 NOTE — Discharge Instructions (Addendum)
 Your imaging and workup this evening was reassuring. I have prescribed a short course of steroids. Your facial droop appears consistent with Bell's Palsy. Follow up with your primary care provider. Return to the emergency department if you develop any life threatening symptoms.

## 2024-01-15 NOTE — ED Notes (Signed)
 PT family updated about delay in MRI. PT taken to the restroom in wheelchair.

## 2024-01-15 NOTE — ED Notes (Signed)
 Patient transported to MRI

## 2024-01-17 ENCOUNTER — Encounter: Payer: Self-pay | Admitting: Orthopedic Surgery

## 2024-01-17 ENCOUNTER — Ambulatory Visit (INDEPENDENT_AMBULATORY_CARE_PROVIDER_SITE_OTHER): Admitting: Orthopedic Surgery

## 2024-01-17 VITALS — BP 170/70 | HR 60 | Temp 97.3°F | Resp 16 | Ht 63.0 in

## 2024-01-17 DIAGNOSIS — I10 Essential (primary) hypertension: Secondary | ICD-10-CM

## 2024-01-17 DIAGNOSIS — G510 Bell's palsy: Secondary | ICD-10-CM

## 2024-01-17 DIAGNOSIS — R350 Frequency of micturition: Secondary | ICD-10-CM

## 2024-01-17 DIAGNOSIS — R634 Abnormal weight loss: Secondary | ICD-10-CM

## 2024-01-17 DIAGNOSIS — E876 Hypokalemia: Secondary | ICD-10-CM | POA: Diagnosis not present

## 2024-01-17 DIAGNOSIS — R5381 Other malaise: Secondary | ICD-10-CM

## 2024-01-17 DIAGNOSIS — E440 Moderate protein-calorie malnutrition: Secondary | ICD-10-CM | POA: Diagnosis not present

## 2024-01-17 DIAGNOSIS — F01B3 Vascular dementia, moderate, with mood disturbance: Secondary | ICD-10-CM

## 2024-01-17 LAB — COMPREHENSIVE METABOLIC PANEL WITH GFR
AG Ratio: 0.9 (calc) — ABNORMAL LOW (ref 1.0–2.5)
ALT: 24 U/L (ref 6–29)
AST: 37 U/L — ABNORMAL HIGH (ref 10–35)
Albumin: 3.5 g/dL — ABNORMAL LOW (ref 3.6–5.1)
Alkaline phosphatase (APISO): 91 U/L (ref 37–153)
BUN/Creatinine Ratio: 33 (calc) — ABNORMAL HIGH (ref 6–22)
BUN: 27 mg/dL — ABNORMAL HIGH (ref 7–25)
CO2: 32 mmol/L (ref 20–32)
Calcium: 10.9 mg/dL — ABNORMAL HIGH (ref 8.6–10.4)
Chloride: 101 mmol/L (ref 98–110)
Creat: 0.82 mg/dL (ref 0.60–0.95)
Globulin: 4 g/dL — ABNORMAL HIGH (ref 1.9–3.7)
Glucose, Bld: 86 mg/dL (ref 65–99)
Potassium: 3.7 mmol/L (ref 3.5–5.3)
Sodium: 138 mmol/L (ref 135–146)
Total Bilirubin: 0.8 mg/dL (ref 0.2–1.2)
Total Protein: 7.5 g/dL (ref 6.1–8.1)
eGFR: 70 mL/min/1.73m2 (ref >=60)

## 2024-01-17 LAB — CBC WITH DIFFERENTIAL/PLATELET
Absolute Lymphocytes: 774 {cells}/uL — ABNORMAL LOW (ref 850–3900)
Absolute Monocytes: 692 {cells}/uL (ref 200–950)
Basophils Absolute: 9 {cells}/uL (ref 0–200)
Basophils Relative: 0.1 %
Eosinophils Absolute: 0 {cells}/uL — ABNORMAL LOW (ref 15–500)
Eosinophils Relative: 0 %
HCT: 37.3 % (ref 35.0–45.0)
Hemoglobin: 12.3 g/dL (ref 11.7–15.5)
MCH: 31.2 pg (ref 27.0–33.0)
MCHC: 33 g/dL (ref 32.0–36.0)
MCV: 94.7 fL (ref 80.0–100.0)
MPV: 10.9 fL (ref 7.5–12.5)
Monocytes Relative: 7.6 %
Neutro Abs: 7626 {cells}/uL (ref 1500–7800)
Neutrophils Relative %: 83.8 %
Platelets: 104 Thousand/uL — ABNORMAL LOW (ref 140–400)
RBC: 3.94 Million/uL (ref 3.80–5.10)
RDW: 12 % (ref 11.0–15.0)
Total Lymphocyte: 8.5 %
WBC: 9.1 Thousand/uL (ref 3.8–10.8)

## 2024-01-17 MED ORDER — CEPHALEXIN 500 MG PO CAPS: 500.0000 mg | ORAL_CAPSULE | Freq: Three times a day (TID) | ORAL | 0 refills | Status: DC

## 2024-01-17 MED ORDER — AMLODIPINE BESYLATE 5 MG PO TABS: 5.0000 mg | ORAL_TABLET | Freq: Every day | ORAL | 1 refills | Status: AC

## 2024-01-17 NOTE — Progress Notes (Unsigned)
 Careteam: Patient Care Team: Gil Greig BRAVO, NP as PCP - General (Adult Health Nurse Practitioner)  Seen by: Greig Gil, AGNP-C  PLACE OF SERVICE:  [facility]  Advanced Directive information    No Known Allergies  Chief Complaint  Patient presents with  . Hospitalization Follow-up    01/14/2024-01/15/2024 for Bells Palsy.      HPI: Patient is a 83 y.o. female ***  Review of Systems:  ROS***  Past Medical History:  Diagnosis Date  . Arthritis   . Hepatitis B   . Hypertension    Past Surgical History:  Procedure Laterality Date  . CHOLECYSTECTOMY    . TOTAL HIP ARTHROPLASTY Right    Social History:   reports that she quit smoking about 25 years ago. Her smoking use included cigarettes. She started smoking about 65 years ago. She has a 20 pack-year smoking history. She has never used smokeless tobacco. She reports that she does not drink alcohol and does not use drugs.  Family History  Problem Relation Age of Onset  . Stroke Mother   . Cancer Father        Brain  . Healthy Daughter   . Healthy Daughter   . Healthy Son     Medications: Patient's Medications  New Prescriptions   No medications on file  Previous Medications   ATENOLOL  (TENORMIN ) 50 MG TABLET    TAKE 1 TABLET(50 MG) BY MOUTH DAILY   BREXPIPRAZOLE  (REXULTI ) 1 MG TABS TABLET    Take 1 tablet (1 mg total) by mouth daily.   CHOLECALCIFEROL (VITAMIN D3 PO)    Take 10 mg by mouth.   FUROSEMIDE  (LASIX ) 20 MG TABLET    TAKE 1 TABLET(20 MG) BY MOUTH DAILY AS NEEDED FOR ANKLE SWELLING   MEMANTINE  (NAMENDA ) 5 MG TABLET    Take 5 mg by mouth daily.   POTASSIUM CHLORIDE  (KLOR-CON ) 10 MEQ TABLET    TAKE 1 TABLET(10 MEQ) BY MOUTH DAILY WITH FUROSEMIDE  AS NEEDED   PREDNISONE  (DELTASONE ) 10 MG TABLET    Take 6 tablets (60 mg total) by mouth daily for 5 days.   TRAMADOL  (ULTRAM ) 50 MG TABLET    TAKE 1 TABLET(50 MG) BY MOUTH TWICE DAILY AS NEEDED   VALSARTAN  (DIOVAN ) 160 MG TABLET    TAKE 1 TABLET(160 MG) BY  MOUTH DAILY  Modified Medications   No medications on file  Discontinued Medications   No medications on file    Physical Exam:  Vitals:   01/17/24 1517 01/17/24 1539  BP: (!) 180/80 (!) 170/70  Pulse: 60   Resp: 16   Temp: (!) 97.3 F (36.3 C)   SpO2: 92%   Height: 5' 3 (1.6 m)    Body mass index is 24.8 kg/m. Wt Readings from Last 3 Encounters:  01/14/24 140 lb (63.5 kg)  12/13/23 140 lb 6.4 oz (63.7 kg)  09/20/23 142 lb 12.8 oz (64.8 kg)    Physical Exam***  Labs reviewed: Basic Metabolic Panel: Recent Labs    06/27/23 1241 06/27/23 1242 12/13/23 1136 01/14/24 1941 01/14/24 1946  NA 138  --  139 138 142  K 4.1  --  4.6 3.3* 3.4*  CL 104  --  104 103 102  CO2 32  --  31 27  --   GLUCOSE 86  --  93 139* 140*  BUN 18  --  16 14 15   CREATININE 0.77  --  0.79 0.99 0.90  CALCIUM  10.5*  --  10.2  10.2  --   TSH  --  0.906  --   --   --    Liver Function Tests: Recent Labs    06/27/23 1241 12/13/23 1136 01/14/24 1941  AST 29 30 43*  ALT 13 13 21   ALKPHOS 90  --  82  BILITOT 0.8 0.6 1.0  PROT 7.6 7.2 7.2  ALBUMIN 3.7  --  3.0*   No results for input(s): LIPASE, AMYLASE in the last 8760 hours. No results for input(s): AMMONIA in the last 8760 hours. CBC: Recent Labs    08/30/23 0816 12/13/23 1136 01/14/24 1941 01/14/24 1946  WBC 4.5 3.4* 5.0  --   NEUTROABS 2.3 1,880 2.5  --   HGB 11.8* 11.0* 12.0 10.9*  HCT 33.8* 34.8* 36.5 32.0*  MCV 97.1 97.8 94.6  --   PLT 93* 84* 94*  --    Lipid Panel: No results for input(s): CHOL, HDL, LDLCALC, TRIG, CHOLHDL, LDLDIRECT in the last 8760 hours. TSH: Recent Labs    06/27/23 1242  TSH 0.906   A1C: No results found for: HGBA1C   Assessment/Plan There are no diagnoses linked to this encounter.  Next appt: *** Diondra Pines Gil BODILY  [facility] [phone]

## 2024-01-17 NOTE — Patient Instructions (Addendum)
 Comfort keepers for help at home   Stop Rexulti   Adding amlodipine  due to elevated blood pressure  Start Keflex  for suspected UTI  Take to ED if physical deconditioning worsens  Consider cameras in her apartment

## 2024-01-18 ENCOUNTER — Ambulatory Visit: Payer: Self-pay | Admitting: Orthopedic Surgery

## 2024-01-18 ENCOUNTER — Other Ambulatory Visit: Payer: Self-pay | Admitting: Orthopedic Surgery

## 2024-01-18 DIAGNOSIS — M25473 Effusion, unspecified ankle: Secondary | ICD-10-CM

## 2024-01-21 ENCOUNTER — Emergency Department (HOSPITAL_COMMUNITY)

## 2024-01-21 ENCOUNTER — Inpatient Hospital Stay (HOSPITAL_COMMUNITY)
Admission: EM | Admit: 2024-01-21 | Discharge: 2024-01-25 | DRG: 917 | Disposition: A | Discharge status: Skilled Nursing Facility | Source: Skilled Nursing Facility | Attending: Family Medicine | Admitting: Family Medicine

## 2024-01-21 ENCOUNTER — Encounter (HOSPITAL_COMMUNITY): Payer: Self-pay | Admitting: Internal Medicine

## 2024-01-21 ENCOUNTER — Other Ambulatory Visit: Payer: Self-pay

## 2024-01-21 DIAGNOSIS — Z87891 Personal history of nicotine dependence: Secondary | ICD-10-CM

## 2024-01-21 DIAGNOSIS — Z1152 Encounter for screening for COVID-19: Secondary | ICD-10-CM

## 2024-01-21 DIAGNOSIS — H9193 Unspecified hearing loss, bilateral: Secondary | ICD-10-CM | POA: Diagnosis present

## 2024-01-21 DIAGNOSIS — Z79891 Long term (current) use of opiate analgesic: Secondary | ICD-10-CM

## 2024-01-21 DIAGNOSIS — F0390 Unspecified dementia without behavioral disturbance: Secondary | ICD-10-CM | POA: Diagnosis present

## 2024-01-21 DIAGNOSIS — W1830XA Fall on same level, unspecified, initial encounter: Secondary | ICD-10-CM | POA: Diagnosis present

## 2024-01-21 DIAGNOSIS — F0152 Vascular dementia, unspecified severity, with psychotic disturbance: Secondary | ICD-10-CM | POA: Diagnosis present

## 2024-01-21 DIAGNOSIS — Y9289 Other specified places as the place of occurrence of the external cause: Secondary | ICD-10-CM

## 2024-01-21 DIAGNOSIS — G928 Other toxic encephalopathy: Secondary | ICD-10-CM | POA: Diagnosis present

## 2024-01-21 DIAGNOSIS — Z9049 Acquired absence of other specified parts of digestive tract: Secondary | ICD-10-CM

## 2024-01-21 DIAGNOSIS — I10 Essential (primary) hypertension: Secondary | ICD-10-CM | POA: Diagnosis present

## 2024-01-21 DIAGNOSIS — F01B2 Vascular dementia, moderate, with psychotic disturbance: Secondary | ICD-10-CM

## 2024-01-21 DIAGNOSIS — T380X1A Poisoning by glucocorticoids and synthetic analogues, accidental (unintentional), initial encounter: Secondary | ICD-10-CM | POA: Diagnosis not present

## 2024-01-21 DIAGNOSIS — R410 Disorientation, unspecified: Principal | ICD-10-CM

## 2024-01-21 DIAGNOSIS — W19XXXA Unspecified fall, initial encounter: Secondary | ICD-10-CM

## 2024-01-21 DIAGNOSIS — T380X5A Adverse effect of glucocorticoids and synthetic analogues, initial encounter: Secondary | ICD-10-CM | POA: Diagnosis present

## 2024-01-21 DIAGNOSIS — Z66 Do not resuscitate: Secondary | ICD-10-CM | POA: Diagnosis present

## 2024-01-21 DIAGNOSIS — G510 Bell's palsy: Secondary | ICD-10-CM | POA: Diagnosis present

## 2024-01-21 DIAGNOSIS — Z79899 Other long term (current) drug therapy: Secondary | ICD-10-CM

## 2024-01-21 DIAGNOSIS — D696 Thrombocytopenia, unspecified: Secondary | ICD-10-CM | POA: Diagnosis present

## 2024-01-21 DIAGNOSIS — Z96641 Presence of right artificial hip joint: Secondary | ICD-10-CM | POA: Diagnosis present

## 2024-01-21 DIAGNOSIS — S0003XA Contusion of scalp, initial encounter: Secondary | ICD-10-CM | POA: Diagnosis present

## 2024-01-21 DIAGNOSIS — G8929 Other chronic pain: Secondary | ICD-10-CM

## 2024-01-21 LAB — URINALYSIS, ROUTINE W REFLEX MICROSCOPIC
Bilirubin Urine: NEGATIVE
Glucose, UA: NEGATIVE mg/dL
Hgb urine dipstick: NEGATIVE
Ketones, ur: NEGATIVE mg/dL
Leukocytes,Ua: NEGATIVE
Nitrite: NEGATIVE
Protein, ur: NEGATIVE mg/dL
Specific Gravity, Urine: 1.012 (ref 1.005–1.030)
pH: 8 (ref 5.0–8.0)

## 2024-01-21 LAB — COMPREHENSIVE METABOLIC PANEL WITH GFR
ALT: 26 U/L (ref 0–44)
AST: 38 U/L (ref 15–41)
Albumin: 3.7 g/dL (ref 3.5–5.0)
Alkaline Phosphatase: 117 U/L (ref 38–126)
Anion gap: 9 (ref 5–15)
BUN: 13 mg/dL (ref 8–23)
CO2: 30 mmol/L (ref 22–32)
Calcium: 10.2 mg/dL (ref 8.9–10.3)
Chloride: 105 mmol/L (ref 98–111)
Creatinine, Ser: 0.7 mg/dL (ref 0.44–1.00)
GFR, Estimated: >60 mL/min (ref >60)
Glucose, Bld: 103 mg/dL — ABNORMAL HIGH (ref 70–99)
Potassium: 3.7 mmol/L (ref 3.5–5.1)
Sodium: 143 mmol/L (ref 135–145)
Total Bilirubin: 1.3 mg/dL — ABNORMAL HIGH (ref 0.0–1.2)
Total Protein: 7.2 g/dL (ref 6.5–8.1)

## 2024-01-21 LAB — CBC
HCT: 35 % — ABNORMAL LOW (ref 36.0–46.0)
Hemoglobin: 12.5 g/dL (ref 12.0–15.0)
MCH: 36.1 pg — ABNORMAL HIGH (ref 26.0–34.0)
MCHC: 35.7 g/dL (ref 30.0–36.0)
MCV: 101.2 fL — ABNORMAL HIGH (ref 80.0–100.0)
Platelets: 82 K/uL — ABNORMAL LOW (ref 150–400)
RBC: 3.46 MIL/uL — ABNORMAL LOW (ref 3.87–5.11)
RDW: 15.3 % (ref 11.5–15.5)
WBC: 4.2 K/uL (ref 4.0–10.5)
nRBC: 0 % (ref 0.0–0.2)

## 2024-01-21 LAB — LIPASE, BLOOD: Lipase: 25 U/L (ref 11–51)

## 2024-01-21 LAB — ETHANOL: Alcohol, Ethyl (B): <15 mg/dL (ref <15)

## 2024-01-21 LAB — URINE DRUG SCREEN
Amphetamines: NEGATIVE
Barbiturates: NEGATIVE
Benzodiazepines: NEGATIVE
Cocaine: NEGATIVE
Fentanyl: NEGATIVE
Methadone Scn, Ur: NEGATIVE
Opiates: NEGATIVE
Tetrahydrocannabinol: NEGATIVE

## 2024-01-21 LAB — RESP PANEL BY RT-PCR (RSV, FLU A&B, COVID)  RVPGX2
Influenza A by PCR: NEGATIVE
Influenza B by PCR: NEGATIVE
Resp Syncytial Virus by PCR: NEGATIVE
SARS Coronavirus 2 by RT PCR: NEGATIVE

## 2024-01-21 LAB — TSH: TSH: 0.571 u[IU]/mL (ref 0.350–4.500)

## 2024-01-21 LAB — VITAMIN B12: Vitamin B-12: 561 pg/mL (ref 180–914)

## 2024-01-21 LAB — CBG MONITORING, ED: Glucose-Capillary: 110 mg/dL — ABNORMAL HIGH (ref 70–99)

## 2024-01-21 LAB — FOLATE: Folate: 12.7 ng/mL (ref >5.9)

## 2024-01-21 LAB — CK: Total CK: 166 U/L (ref 38–234)

## 2024-01-21 LAB — AMMONIA: Ammonia: 18 umol/L (ref 9–35)

## 2024-01-21 MED ORDER — ATENOLOL 25 MG PO TABS
50.0000 mg | ORAL_TABLET | Freq: Every day | ORAL | Status: DC
  Administered 2024-01-21 – 2024-01-25 (×5): 50 mg via ORAL
  Filled 2024-01-21 (×5): qty 2

## 2024-01-21 MED ORDER — AMLODIPINE BESYLATE 5 MG PO TABS
5.0000 mg | ORAL_TABLET | Freq: Every day | ORAL | Status: DC
Start: 2024-01-21 — End: 2024-01-25
  Administered 2024-01-21 – 2024-01-25 (×5): 5 mg via ORAL
  Filled 2024-01-21 (×5): qty 1

## 2024-01-21 MED ORDER — ACETAMINOPHEN 325 MG PO TABS
650.0000 mg | ORAL_TABLET | Freq: Four times a day (QID) | ORAL | Status: DC | PRN
  Administered 2024-01-22 – 2024-01-24 (×3): 650 mg via ORAL
  Filled 2024-01-21 (×3): qty 2

## 2024-01-21 MED ORDER — ACETAMINOPHEN 650 MG RE SUPP
650.0000 mg | Freq: Four times a day (QID) | RECTAL | Status: DC | PRN
Start: 2024-01-21 — End: 2024-01-25

## 2024-01-21 MED ORDER — ONDANSETRON HCL 4 MG/2ML IJ SOLN: 4.0000 mg | Freq: Four times a day (QID) | INTRAMUSCULAR | Status: DC | PRN

## 2024-01-21 MED ORDER — TRAZODONE HCL 50 MG PO TABS
25.0000 mg | ORAL_TABLET | Freq: Every evening | ORAL | Status: DC | PRN
  Administered 2024-01-21 – 2024-01-23 (×3): 25 mg via ORAL
  Filled 2024-01-21 (×3): qty 1

## 2024-01-21 MED ORDER — IRBESARTAN 150 MG PO TABS
150.0000 mg | ORAL_TABLET | Freq: Every day | ORAL | Status: DC
  Administered 2024-01-21 – 2024-01-25 (×5): 150 mg via ORAL
  Filled 2024-01-21 (×6): qty 1

## 2024-01-21 MED ORDER — SODIUM CHLORIDE 0.9 % IV BOLUS
500.0000 mL | Freq: Once | INTRAVENOUS | Status: AC
  Administered 2024-01-21: 500 mL via INTRAVENOUS

## 2024-01-21 MED ORDER — ONDANSETRON HCL 4 MG PO TABS: 4.0000 mg | ORAL_TABLET | Freq: Four times a day (QID) | ORAL | Status: DC | PRN

## 2024-01-21 MED ORDER — ALBUTEROL SULFATE (2.5 MG/3ML) 0.083% IN NEBU: 2.5000 mg | INHALATION_SOLUTION | RESPIRATORY_TRACT | Status: DC | PRN

## 2024-01-21 MED ORDER — ENOXAPARIN SODIUM 40 MG/0.4ML IJ SOSY: 40.0000 mg | PREFILLED_SYRINGE | INTRAMUSCULAR | Status: DC

## 2024-01-21 NOTE — ED Notes (Signed)
 Pt has changed into clean brief and chuck pad under her after in/out cath

## 2024-01-21 NOTE — Plan of Care (Signed)

## 2024-01-21 NOTE — ED Triage Notes (Signed)
 Pt BIBA from [facility] for fall last evening and altered mental status x 1 week.  Per daughter, pt was found this morning on the floor,  unknown duration. No blood thinners.  Hematoma on right side of pt head. Last seen yesterday around 8pm.  Pt has increasing altered mental status with recent UTI and dementia diagnosis.    BP 180/110 HR 102 CBG 110 14 RR
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
## 2024-01-21 NOTE — Evaluation (Cosign Needed)
 Clinical/Bedside Swallow Evaluation Patient Details  Name: Melissa Fletcher MRN: [id number] Date of Birth: [birth year]  Today's Date: 01/21/2024 Time: SLP Start Time (ACUTE ONLY): 1440 SLP Stop Time (ACUTE ONLY): 1513 SLP Time Calculation (min) (ACUTE ONLY): 33 min  Past Medical History:  Past Medical History:  Diagnosis Date   Arthritis    Hepatitis B    Hypertension    Past Surgical History:  Past Surgical History:  Procedure Laterality Date   CHOLECYSTECTOMY     TOTAL HIP ARTHROPLASTY Right    HPI:  Patient is an 83 y.o. female with medical history significant for hypertension, arthritis, recent diagnosis of vascular dementia and Bell's Palsy being admitted to the hospital with confusion, hallucinations, after being found down at home this morning. Patient lives in independent living at a local facility, she has 2 daughters who are closely involved in her care. They have noted that over the last 10 to 15 days, she has had progressive decline in her ability to perform her ADLs, intermittent confusion, and memory loss. She was recently evaluated in the ER at [facility] on 8/25 evaluated for weakness and facial droop, felt to have Bell's Palsy with slurred speech and drooling from the left side of her mouth. Her daughter found her on the ground outside the door to her bedroom this morning.  Bedside swallow evaluation ordered for SLP to assess swallow function.    Assessment / Plan / Recommendation  Clinical Impression  SLP entered room and patient shared that she had just finished her lunch. She was not hungry or thirst but shared that she'd enjoy something sweet. When offered ice cream or jello, the patient chose jello. The patient demonstrated some difficulty feeding herself jello. No overt s/s aspiration with jello. Patient drank water out of straw and exhibited belching after sequential sips. SLP questioned possible GERD, but patient denied any previous issues with belching or hiccups  after drinking and eating. SLP shared lack of concern with the patients swallow to the daughter and the daughter agreed. No SLP follow up is needed.      Aspiration Risk       Diet Recommendation Regular;Thin liquid    Liquid Administration via: Straw;Cup;Spoon Medication Administration: Other (Comment) (as tolerated) Supervision: Staff to assist with self feeding Compensations: Minimize environmental distractions;Slow rate;Small sips/bites Postural Changes: Seated upright at 90 degrees;Remain upright for at least 30 minutes after po intake    Other  Recommendations Oral Care Recommendations: Oral care BID     Assistance Recommended at Discharge    Functional Status Assessment Patient has had a recent decline in their functional status and demonstrates the ability to make significant improvements in function in a reasonable and predictable amount of time.  Frequency and Duration            Prognosis Prognosis for improved oropharyngeal function: Good      Swallow Study   General Date of Onset: 01/21/24 HPI: Patient is an 83 y.o. female with medical history significant for hypertension, arthritis, recent diagnosis of vascular dementia and Bell's Palsy being admitted to the hospital with confusion, hallucinations, after being found down at home this morning. Patient lives in independent living at a local facility, she has 2 daughters who are closely involved in her care. They have noted that over the last 10 to 15 days, she has had progressive decline in her ability to perform her ADLs, intermittent confusion, and memory loss. She was recently evaluated in the ER at [facility]  on 8/25 evaluated for weakness and facial droop, felt to have Bell's Palsy with slurred speech and drooling from the left side of her mouth. Her daughter found her on the ground outside the door to her bedroom this morning.  Bedside swallow evaluation ordered for SLP to assess swallow function. Type of Study:  Bedside Swallow Evaluation Diet Prior to this Study: Regular;Thin liquids (Level 0) Temperature Spikes Noted: No Respiratory Status: Room air History of Recent Intubation: No Behavior/Cognition: Alert;Cooperative;Pleasant mood Oral Care Completed by SLP: No Self-Feeding Abilities: Needs assist Patient Positioning: Upright in bed    Oral/Motor/Sensory Function Overall Oral Motor/Sensory Function: Within functional limits   Ice Chips     Thin Liquid Thin Liquid: Within functional limits Presentation: Straw    Nectar Thick     Honey Thick     Puree     Solid           Damien Hy  Graduate SLP Clinican

## 2024-01-21 NOTE — ED Provider Notes (Signed)
 Emergency Department Provider Note   I have reviewed the triage vital signs and the nursing notes.   HISTORY  Chief Complaint Fall (Altered mental status)   HPI Melissa Fletcher is a 83 y.o. female with PMH of HTN, dementia, and recent Bell's palsy diagnosis recently on Prednisone  presents to the [facility] department from her independent living facility after being found down, confused on the floor.  She is not anticoagulated.  Family state that she was diagnosed with dementia 6 to 8 weeks ago but symptoms were mild.  She was managing just fine at the independent living.  Within the past week she has had an abrupt change in her mental status.  She has been very confused, rambling, drooling.  She was seen in the emergency department on 8/25 with left face droop.  She had labs and MRI brain which were normal.  She was started on prednisone  for presumed Bell's palsy.  Symptoms have not worsened over the past week.  She has been much more confused.  She is been requiring significant assistance from family which she did not before.  Family had plans to present to the ED today but when they showed up this morning she was prone on the floor with her walker knocked over to the side.  Level 5 caveat: AMS   Past Medical History:  Diagnosis Date   Arthritis    Hepatitis B    Hypertension     Review of Systems  Level 5 caveat: AMS  ____________________________________________   PHYSICAL EXAM:  VITAL SIGNS: ED Triage Vitals  Encounter Vitals Group     BP 01/21/24 1004 (!) 175/77     Pulse Rate 01/21/24 1003 86     Resp 01/21/24 1003 14     Temp 01/21/24 1000 (!) 97.4 F (36.3 C)     Temp Source 01/21/24 1003 Oral     SpO2 01/21/24 1003 97 %   Constitutional: Alert but confused.  Eyes: Conjunctivae are normal. Head: Hematoma to the frontal scalp.  Nose: No congestion/rhinnorhea. Mouth/Throat: Mucous membranes are moist. Neck: No stridor.  No cervical spine tenderness to  palpation. Cardiovascular: Normal rate, regular rhythm. Good peripheral circulation. Grossly normal heart sounds.   Respiratory: Normal respiratory effort.  No retractions. Lungs CTAB. Gastrointestinal: Soft and nontender. No distention.  Musculoskeletal: No lower extremity tenderness nor edema. No gross deformities of extremities. Normal ROM of the bilateral hips.  Neurologic:  Normal speech and language. No gross focal neurologic deficits are appreciated.  Skin:  Skin is warm, dry and intact. No rash noted. ____________________________________________   LABS (all labs ordered are listed, but only abnormal results are displayed)  Labs Reviewed  COMPREHENSIVE METABOLIC PANEL WITH GFR - Abnormal; Notable for the following components:      Result Value   Glucose, Bld 103 (*)    Total Bilirubin 1.3 (*)    All other components within normal limits  CBC - Abnormal; Notable for the following components:   RBC 3.46 (*)    HCT 35.0 (*)    MCV 101.2 (*)    MCH 36.1 (*)    Platelets 82 (*)    All other components within normal limits  URINALYSIS, ROUTINE W REFLEX MICROSCOPIC - Abnormal; Notable for the following components:   APPearance CLOUDY (*)    All other components within normal limits  CBG MONITORING, ED - Abnormal; Notable for the following components:   Glucose-Capillary 110 (*)    All other components within normal limits  RESP PANEL BY RT-PCR (RSV, FLU A&B, COVID)  RVPGX2  ETHANOL  URINE DRUG SCREEN  LIPASE, BLOOD  CK  VITAMIN B12  FOLATE  TSH  AMMONIA   ____________________________________________  EKG   EKG Interpretation Date/Time:  Monday January 21 2024 10:04:38 EDT Ventricular Rate:  85 PR Interval:    QRS Duration:  137 QT Interval:  399 QTC Calculation: 475 R Axis:   63  Text Interpretation: Normal sinus rhythm Right bundle branch block Artifact limiting read Confirmed by Darra Chew [phone]) on 01/21/2024 10:40:28 AM        ____________________________________________  RADIOLOGY  CT Cervical Spine Wo Contrast Result Date: 01/21/2024 EXAM: CT CERVICAL SPINE WITHOUT CONTRAST 01/21/2024 10:39:00 AM TECHNIQUE: CT of the cervical spine was performed without the administration of intravenous contrast. Multiplanar reformatted images are provided for review. Automated exposure control, iterative reconstruction, and/or weight based adjustment of the mA/kV was utilized to reduce the radiation dose to as low as reasonably achievable. COMPARISON: None available. CLINICAL HISTORY: Neck trauma (Age >= 65y). Pt BIBA from [facility] independent living for fall last evening and altered mental status x 1 week. Per daughter, pt was found this morning on the floor, unknown duration. No blood thinners. Hematoma on right side of pt head. Last seen yesterday around 8pm. Pt has increasing altered mental status with recent UTI and dementia diagnosis. FINDINGS: CERVICAL SPINE: BONES AND ALIGNMENT: Straightening of the normal cervical lordosis is present. Grade 1 degenerative anterolisthesis is present at C3-4, C4-5 and C5-6. Ankylosis is present across the disc space at C6-7. DEGENERATIVE CHANGES: Advanced degenerative changes are present at C1-2. Such spurring contributes to right foraminal narrowing at C3-4 and C4-5. SOFT TISSUES: No prevertebral soft tissue swelling. Atherosclerotic calcifications are present at the carotid bifurcations bilaterally. IMPRESSION: 1. No acute abnormality of the cervical spine related to the reported neck trauma. 2. Straightening of the normal cervical lordosis. 3. Grade 1 degenerative anterolisthesis at C3-4, C4-5 and C5-6. 4. Ankylosis across the disc space at C6-7. 5. Right foraminal narrowing at C3-4 and C4-5 due to spurring. 6. Advanced degenerative changes at C1-2. Electronically signed by: Lonni Necessary MD 01/21/2024 11:32 AM EDT RP Workstation: [id number]   CT Head Wo Contrast Result Date: 01/21/2024 EXAM:  CT HEAD WITHOUT CONTRAST 01/21/2024 10:39:00 AM TECHNIQUE: CT of the head was performed without the administration of intravenous contrast. Automated exposure control, iterative reconstruction, and/or weight based adjustment of the mA/kV was utilized to reduce the radiation dose to as low as reasonably achievable. COMPARISON: CT head without contrast 01/14/2024. MR head without contrast 01/15/2024. CLINICAL HISTORY: Head trauma, minor (Age >= 65y). Pt BIBA from [facility] independent living for fall last evening and altered mental status x 1 week. Per daughter, pt was found this morning on the floor, unknown duration. No blood thinners. Hematoma on right side of pt head. Last seen yesterday around 8pm. Pt has increasing altered mental status with recent UTI and dementia diagnosis. FINDINGS: BRAIN AND VENTRICLES: No acute hemorrhage. No evidence of acute infarct. No hydrocephalus. No extra-axial collection. No mass effect or midline shift. Mild atrophy and moderate diffuse white matter changes are again noted. ORBITS: Uncertain lens replacement. SINUSES: No acute abnormality. SOFT TISSUES AND SKULL: Minimal soft tissue swelling is present in the right supraorbital scalp. No underlying fracture or foreign body is present. IMPRESSION: 1. No acute intracranial abnormality. 2. Mild atrophy and moderate diffuse white matter changes. 3. Minimal soft tissue swelling in the right supraorbital scalp without underlying fracture or foreign  body. Electronically signed by: Lonni Necessary MD 01/21/2024 11:29 AM EDT RP Workstation: [id number]   DG Pelvis Portable Result Date: 01/21/2024 CLINICAL DATA:  fall EXAM: PORTABLE PELVIS 1-2 VIEWS COMPARISON:  None Available. FINDINGS: Osteopenia. Status post RIGHT hip arthroplasty. Visualized hardware is intact. Incomplete visualization of the inferior stem component. No definitive acute fracture or diastasis. Degenerative changes of the lower lumbar spine. Sacrum is obscured by  overlapping bowel contents. IMPRESSION: No definitive acute fracture or diastasis. If there is a persistent clinical concern for nondisplaced hip or pelvic fracture, recommend dedicated pelvic CT or MRI. Electronically Signed   By: Corean Salter M.D.   On: 01/21/2024 10:53   DG Chest Portable 1 View Result Date: 01/21/2024 CLINICAL DATA:  fall EXAM: PORTABLE CHEST 1 VIEW COMPARISON:  January 14, 2024 FINDINGS: The cardiomediastinal silhouette is unchanged in contour.Atherosclerotic calcifications. No pleural effusion. No pneumothorax. Possible nodular opacity at the LEFT upper lobe versus asymmetric costochondral calcification. Otherwise no acute pleuroparenchymal abnormality. IMPRESSION: Possible nodular opacity at the LEFT upper lobe versus prominent costochondral calcification. Recommend further evaluation with dedicated PA and lateral chest radiograph versus dedicated chest CT without contrast. Electronically Signed   By: Corean Salter M.D.   On: 01/21/2024 10:52    ____________________________________________   PROCEDURES  Procedure(s) performed:   Procedures  None  ____________________________________________   INITIAL IMPRESSION / ASSESSMENT AND PLAN / ED COURSE  Pertinent labs & imaging results that were available during my care of the patient were reviewed by me and considered in my medical decision making (see chart for details).   This patient is Presenting for Evaluation of AMS, which does require a range of treatment options, and is a complaint that involves a high risk of morbidity and mortality.  The Differential Diagnoses includes but is not exclusive to alcohol, illicit or prescription medications, intracranial pathology such as stroke, intracerebral hemorrhage, fever or infectious causes including sepsis, hypoxemia, uremia, trauma, endocrine related disorders such as diabetes, hypoglycemia, thyroid -related diseases, etc.   Critical Interventions-    Medications   amLODipine  (NORVASC ) tablet 5 mg (5 mg Oral Given 01/21/24 1324)  irbesartan  (AVAPRO ) tablet 150 mg (150 mg Oral Given 01/21/24 1324)  atenolol  (TENORMIN ) tablet 50 mg (50 mg Oral Given 01/21/24 1323)  acetaminophen  (TYLENOL ) tablet 650 mg (has no administration in time range)    Or  acetaminophen  (TYLENOL ) suppository 650 mg (has no administration in time range)  traZODone  (DESYREL ) tablet 25 mg (has no administration in time range)  ondansetron  (ZOFRAN ) tablet 4 mg (has no administration in time range)    Or  ondansetron  (ZOFRAN ) injection 4 mg (has no administration in time range)  albuterol  (PROVENTIL ) (2.5 MG/3ML) 0.083% nebulizer solution 2.5 mg (has no administration in time range)  sodium chloride  0.9 % bolus 500 mL (0 mLs Intravenous Stopped 01/21/24 1336)    Reassessment after intervention: AMS unchanged.    I did obtain Additional Historical Information from daughter at bedside.    Clinical Laboratory Tests Ordered, included UA without infection.  CMP shows no AKI.  LFTs normal.  CBC without leukocytosis.  UDS negative. EtOH negative.  Radiologic Tests Ordered, included CT head, CXR, pelvis XR. I independently interpreted the images and agree with radiology interpretation.   Cardiac Monitor Tracing which shows NSR.    Social Determinants of Health Risk patient is a non-smoker.   Consult complete with [facility]. Plan for admit.   Medical Decision Making: Summary:  Patient presents to the emergency department with altered mental status.  Symptoms seem to have abruptly worsened over the past week.  She has been on steroid for Bell's palsy and completed the course 2 days ago but altered mental status and weakness have persisted.  Plan for screening blood work.  Reevaluation with update and discussion with patient.  Plan for admit given abrupt mental status change over the past week.  Question if this could be due to recent steroid burst but some symptoms present prior to that. Discussed  with patient and family who are in agreement.   Patient's presentation is most consistent with acute presentation with potential threat to life or bodily function.   Disposition: admit  ____________________________________________  FINAL CLINICAL IMPRESSION(S) / ED DIAGNOSES  Final diagnoses:  Disorientation  Fall, initial encounter    Note:  This document was prepared using Dragon voice recognition software and may include unintentional dictation errors.  Fonda Law, MD, [facility] Emergency Medicine    Kyleigha Markert, Fonda MATSU, MD 01/21/24 [phone]

## 2024-01-22 DIAGNOSIS — T380X1A Poisoning by glucocorticoids and synthetic analogues, accidental (unintentional), initial encounter: Secondary | ICD-10-CM | POA: Diagnosis present

## 2024-01-22 DIAGNOSIS — W1830XA Fall on same level, unspecified, initial encounter: Secondary | ICD-10-CM | POA: Diagnosis present

## 2024-01-22 DIAGNOSIS — H9193 Unspecified hearing loss, bilateral: Secondary | ICD-10-CM | POA: Diagnosis present

## 2024-01-22 DIAGNOSIS — I10 Essential (primary) hypertension: Secondary | ICD-10-CM | POA: Diagnosis present

## 2024-01-22 DIAGNOSIS — Z79891 Long term (current) use of opiate analgesic: Secondary | ICD-10-CM | POA: Diagnosis not present

## 2024-01-22 DIAGNOSIS — Z87891 Personal history of nicotine dependence: Secondary | ICD-10-CM | POA: Diagnosis not present

## 2024-01-22 DIAGNOSIS — S0003XA Contusion of scalp, initial encounter: Secondary | ICD-10-CM | POA: Diagnosis present

## 2024-01-22 DIAGNOSIS — G928 Other toxic encephalopathy: Secondary | ICD-10-CM | POA: Diagnosis present

## 2024-01-22 DIAGNOSIS — Z79899 Other long term (current) drug therapy: Secondary | ICD-10-CM | POA: Diagnosis not present

## 2024-01-22 DIAGNOSIS — Z9049 Acquired absence of other specified parts of digestive tract: Secondary | ICD-10-CM | POA: Diagnosis not present

## 2024-01-22 DIAGNOSIS — Z66 Do not resuscitate: Secondary | ICD-10-CM | POA: Diagnosis present

## 2024-01-22 DIAGNOSIS — F05 Delirium due to known physiological condition: Secondary | ICD-10-CM | POA: Diagnosis not present

## 2024-01-22 DIAGNOSIS — F01B2 Vascular dementia, moderate, with psychotic disturbance: Secondary | ICD-10-CM | POA: Diagnosis not present

## 2024-01-22 DIAGNOSIS — Z1152 Encounter for screening for COVID-19: Secondary | ICD-10-CM | POA: Diagnosis not present

## 2024-01-22 DIAGNOSIS — D696 Thrombocytopenia, unspecified: Secondary | ICD-10-CM | POA: Diagnosis present

## 2024-01-22 DIAGNOSIS — F01B0 Vascular dementia, moderate, without behavioral disturbance, psychotic disturbance, mood disturbance, and anxiety: Secondary | ICD-10-CM | POA: Diagnosis not present

## 2024-01-22 DIAGNOSIS — Z96641 Presence of right artificial hip joint: Secondary | ICD-10-CM | POA: Diagnosis present

## 2024-01-22 DIAGNOSIS — F0152 Vascular dementia, unspecified severity, with psychotic disturbance: Secondary | ICD-10-CM | POA: Diagnosis present

## 2024-01-22 DIAGNOSIS — T380X5A Adverse effect of glucocorticoids and synthetic analogues, initial encounter: Secondary | ICD-10-CM | POA: Diagnosis present

## 2024-01-22 DIAGNOSIS — Y9289 Other specified places as the place of occurrence of the external cause: Secondary | ICD-10-CM | POA: Diagnosis not present

## 2024-01-22 DIAGNOSIS — G510 Bell's palsy: Secondary | ICD-10-CM | POA: Diagnosis present

## 2024-01-22 LAB — CBC
HCT: 35.1 % — ABNORMAL LOW (ref 36.0–46.0)
Hemoglobin: 11.3 g/dL — ABNORMAL LOW (ref 12.0–15.0)
MCH: 31 pg (ref 26.0–34.0)
MCHC: 32.2 g/dL (ref 30.0–36.0)
MCV: 96.4 fL (ref 80.0–100.0)
Platelets: 83 K/uL — ABNORMAL LOW (ref 150–400)
RBC: 3.64 MIL/uL — ABNORMAL LOW (ref 3.87–5.11)
RDW: 13.2 % (ref 11.5–15.5)
WBC: 7 K/uL (ref 4.0–10.5)
nRBC: 0 % (ref 0.0–0.2)

## 2024-01-22 LAB — BASIC METABOLIC PANEL WITH GFR
Anion gap: 9 (ref 5–15)
BUN: 13 mg/dL (ref 8–23)
CO2: 27 mmol/L (ref 22–32)
Calcium: 9.7 mg/dL (ref 8.9–10.3)
Chloride: 105 mmol/L (ref 98–111)
Creatinine, Ser: 0.52 mg/dL (ref 0.44–1.00)
GFR, Estimated: >60 mL/min (ref >60)
Glucose, Bld: 90 mg/dL (ref 70–99)
Potassium: 3.9 mmol/L (ref 3.5–5.1)
Sodium: 141 mmol/L (ref 135–145)

## 2024-01-22 NOTE — NC FL2 (Signed)
   MEDICAID [id number] LEVEL OF CARE FORM     IDENTIFICATION  Patient Name: Melissa Fletcher Birthdate: 15-May-1941 Sex: female Admission Date (Current Location): 01/21/2024  [facility] and [place] Number:  Producer, television/film/video and Address:  [facility],  [address] [place], [place] [address]      Provider Number: [id number]  Attending Physician Name and Address:  Elpidio Reyes DEL, MD  Relative Name and Phone Number:       Current Level of Care: Hospital Recommended Level of Care: Skilled Nursing Facility Prior Approval Number:    Date Approved/Denied:   PASRR Number:    Discharge Plan: SNF    Current Diagnoses: Patient Active Problem List   Diagnosis Date Noted   Dementia (HCC) 01/21/2024   Right hip pain 02/13/2021   Primary hypertension 02/13/2021   Sensorineural hearing loss (SNHL) of both ears 02/13/2021   Cirrhosis of liver without ascites (HCC) 02/13/2021   History of skin cancer 02/13/2021   Aortic atherosclerosis (HCC) 02/13/2021   Hypercalcemia 02/13/2021   Thrombocytopenia (HCC) 02/13/2021    Orientation RESPIRATION BLADDER Height & Weight     Self, Time, Place  Normal Incontinent Weight: 63 kg Height:  5' 2.99 (160 cm)  BEHAVIORAL SYMPTOMS/MOOD NEUROLOGICAL BOWEL NUTRITION STATUS      Incontinent Diet  AMBULATORY STATUS COMMUNICATION OF NEEDS Skin   Extensive Assist Verbally Normal                       Personal Care Assistance Level of Assistance  Bathing, Feeding, Dressing Bathing Assistance: Limited assistance Feeding assistance: Limited assistance Dressing Assistance: Limited assistance     Functional Limitations Info  Hearing   Hearing Info: Impaired      SPECIAL CARE FACTORS FREQUENCY  PT (By licensed PT), OT (By licensed OT)     PT Frequency: 5X weekly OT Frequency: 5X weekly            Contractures Contractures Info: Not present    Additional Factors Info  Code Status Code Status Info: DNR Limited              Current Medications (01/22/2024):  This is the current hospital active medication list Current Facility-Administered Medications  Medication Dose Route Frequency Provider Last Rate Last Admin   acetaminophen  (TYLENOL ) tablet 650 mg  650 mg Oral Q6H PRN Zella, Mir M, MD       Or   acetaminophen  (TYLENOL ) suppository 650 mg  650 mg Rectal Q6H PRN Zella, Mir M, MD       albuterol  (PROVENTIL ) (2.5 MG/3ML) 0.083% nebulizer solution 2.5 mg  2.5 mg Nebulization Q2H PRN Zella, Mir M, MD       amLODipine  (NORVASC ) tablet 5 mg  5 mg Oral Daily Ikramullah, Mir M, MD   5 mg at 01/22/24 1013   atenolol  (TENORMIN ) tablet 50 mg  50 mg Oral Daily Zella, Mir M, MD   50 mg at 01/22/24 1013   irbesartan  (AVAPRO ) tablet 150 mg  150 mg Oral Daily Zella, Mir M, MD   150 mg at 01/22/24 1013   ondansetron  (ZOFRAN ) tablet 4 mg  4 mg Oral Q6H PRN Zella, Mir M, MD       Or   ondansetron  (ZOFRAN ) injection 4 mg  4 mg Intravenous Q6H PRN Zella, Mir M, MD       traZODone  (DESYREL ) tablet 25 mg  25 mg Oral QHS PRN Zella, Mir M, MD   25 mg at  01/21/24 2156     Discharge Medications: Please see discharge summary for a list of discharge medications.  Relevant Imaging Results:  Relevant Lab Results:   Additional Information SSN  [id number]  Doneta Glenys DASEN, RN

## 2024-01-22 NOTE — TOC PASRR Note (Signed)
 30 Day PASRR Note   Patient Details  Name: Melissa Fletcher Date of Birth: [birth year]   Transition [facility] CM/SW Contact:    Doneta Glenys DASEN, RN Phone Number: 01/22/2024, 2:29 PM  To Whom It May Concern:  Please be advised that this patient will require a short-term nursing home stay - anticipated 30 days or less for rehabilitation and strengthening.   The plan is for return home.

## 2024-01-22 NOTE — Progress Notes (Signed)
 Chaplains received a consult to discuss HCPOA with Andromeda's daughter.  I called [name] and she stated that they found an existing document and no longer have need for assistance with that.
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
## 2024-01-22 NOTE — Plan of Care (Signed)

## 2024-01-22 NOTE — TOC Initial Note (Addendum)
 Transition of Care [facility]) - Initial/Assessment Note    Patient Details  Name: Melissa Fletcher MRN: [id number] Date of Birth: 1941-02-26  [facility]) CM/SW Contact:    Doneta Glenys DASEN, RN Phone Number: 01/22/2024, 9:30 AM  Clinical Narrative:                 Presented for a fall from [facility]. CM called Patients daughter Greig Decent [phone] left message. 10:17 AM [name] patients daughter returned call. [name] states that PTA patient uses a rollator for ambulation and was independent prior to 10 days. We discussed HC POA and LTC process. CM waiting PT/OT recommendations. CM discussed MOON with [name] and was given permission to sign. 4:54 PM CM spoke with patients daughter [name]. Discussed PT recommendation for SNF and she is agreeable to sending referrals. SNF referrals sent via HUB. FL2 completed PASRR started. Level 2. Additional documents (H&P, FL2, 30 day Note) uploaded to [place] Must. Waiting bed offers.  Patient Goals and CMS Choice    Expected Discharge Plan and Services    Prior Living Arrangements/Services    Activities of Daily Living   ADL Screening (condition at time of admission) Independently performs ADLs?: No Does the patient have a NEW difficulty with bathing/dressing/toileting/self-feeding that is expected to last >3 days?: Yes (Initiates electronic notice to provider for possible OT consult) Does the patient have a NEW difficulty with getting in/out of bed, walking, or climbing stairs that is expected to last >3 days?: Yes (Initiates electronic notice to provider for possible PT consult) Does the patient have a NEW difficulty with communication that is expected to last >3 days?: Yes (Initiates electronic notice to provider for possible SLP consult) Is the patient deaf or have difficulty hearing?: Yes (hearing aids) Does the patient have difficulty seeing, even when wearing glasses/contacts?: Yes Does the patient have difficulty  concentrating, remembering, or making decisions?: Yes  Permission Sought/Granted                  Emotional Assessment              Admission diagnosis:  Dementia (HCC) [F03.90] Patient Active Problem List   Diagnosis Date Noted   Dementia (HCC) 01/21/2024   Right hip pain 02/13/2021   Primary hypertension 02/13/2021   Sensorineural hearing loss (SNHL) of both ears 02/13/2021   Cirrhosis of liver without ascites (HCC) 02/13/2021   History of skin cancer 02/13/2021   Aortic atherosclerosis (HCC) 02/13/2021   Hypercalcemia 02/13/2021   Thrombocytopenia (HCC) 02/13/2021   PCP:  Gil Greig BRAVO, NP Pharmacy:   [facility] [phone] - [place] [address] AT [facility] [address] [place]  [address] Phone: [phone] Fax: [phone]     Social Drivers of Health (SDOH) Social History: SDOH Screenings   Food Insecurity: No Food Insecurity (01/21/2024)  Housing: Low Risk  (01/21/2024)  Transportation Needs: No Transportation Needs (01/21/2024)  Utilities: Not At Risk (01/21/2024)  Alcohol Screen: Low Risk  (09/08/2021)  Depression (PHQ2-9): Low Risk  (09/20/2023)  Financial Resource Strain: Low Risk  (09/08/2021)  Physical Activity: Inactive (09/08/2021)  Social Connections: Socially Isolated (01/21/2024)  Stress: No Stress Concern Present (09/08/2021)  Tobacco Use: Medium Risk (01/21/2024)   SDOH Interventions:     Readmission Risk Interventions     No data to display

## 2024-01-22 NOTE — Evaluation (Signed)
 Physical Therapy Evaluation Patient Details Name: Melissa Fletcher MRN: [id number] DOB: Aug 22, 1940 Today's Date: 01/22/2024  History of Present Illness  Pt is a 83 y.o. female presenting with AMS, hallucinations, UTI and fall on 01/20/2024. Pt was found down by daughters at ILF, [place]. Pt PMH significant for recent diagnosis of Bell's Palsy & vascular dementia, OA, HTN, and R hip arthroplasty.  Clinical Impression    Pt admitted with above diagnosis.  Pt currently with functional limitations due to the deficits listed below (see PT Problem List). Pt in bed when therapist arrived. Pt agreeable to therapy intervention. Pt appears to have some confusion and is a poor historian unable to provide insight to PLOF and no family present at time of eval. Pt required increased time for all motor processing and planning. Pt is HOH and required increased volume with repetitive and or multimodal cues. Pt required min A for supine to sit with use of hospital bed, min A for sit to stand  from EOB and commode, cues for maintaining B grasp on RW during transfer and gait tasks, pt required cues for safety, RW management proper body position inside RW and exhibited shuffling PD like gait pattern with B knee hip and trunk flexion with slow cadence 15 feet x 2. Pt demonstrated LOB x 2 with gait and transfer tasks. Pt left seated in recliner and nurse tech present. Patient will benefit from continued inpatient follow up therapy, <3 hours/day.  Pt will benefit from acute skilled PT to increase their independence and safety with mobility to allow discharge.         If plan is discharge home, recommend the following: A little help with walking and/or transfers;A little help with bathing/dressing/bathroom;Assistance with cooking/housework;Assist for transportation   Can travel by private vehicle   Yes    Equipment Recommendations Rolling walker (2 wheels)  Recommendations for Other Services       Functional Status  Assessment Patient has had a recent decline in their functional status and demonstrates the ability to make significant improvements in function in a reasonable and predictable amount of time.     Precautions / Restrictions Precautions Precautions: Fall Restrictions Weight Bearing Restrictions Per Provider Order: No      Mobility  Bed Mobility Overal bed mobility: Needs Assistance Bed Mobility: Supine to Sit     Supine to sit: Min assist, HOB elevated, Used rails     General bed mobility comments: increased time and cues, pt reaching for therapist to assist, pt exhibited R lateral and anterior LOB seated EOB    Transfers Overall transfer level: Needs assistance Equipment used: Rolling walker (2 wheels) Transfers: Sit to/from Stand Sit to Stand: Min assist           General transfer comment: pt required min A to power up from EOB and commode, min A for balance, specific cues for proper UE and AD placement with poor recall and A for RW management    Ambulation/Gait Ambulation/Gait assistance: Min assist Gait Distance (Feet): 15 Feet Assistive device: Rolling walker (2 wheels) Gait Pattern/deviations: Step-to pattern, Trunk flexed, Narrow base of support Gait velocity: decreased     General Gait Details: erratic shuffling PD like gait pattern with B knee, hip and trunk flexion with min A for balance, strong cues for safety and use of RW, RW placement, proper body position inside RW and PT required to advace RW to bathroom on the way back from the bathroom pt required continued min A for safety,  balacne and multimodal cues however demonstrated improved abiltity to progress RW, pt electing to take 2 standing rest breaks in which pt performed trunk extension, pt reported no pain, no SOB and no dizziness  Stairs            Wheelchair Mobility     Tilt Bed    Modified Rankin (Stroke Patients Only)       Balance Overall balance assessment: History of Falls, Needs  assistance Sitting-balance support: Feet supported Sitting balance-Leahy Scale: Fair Sitting balance - Comments: pt initally required min A for static sitting balance, cues for attention to midline and pt able to progress to CGA and cues Postural control: Right lateral lean (anterior) Standing balance support: Bilateral upper extremity supported, During functional activity, Reliant on assistive device for balance Standing balance-Leahy Scale: Poor Standing balance comment: LOB with gait and standing balance with B UE support                             Pertinent Vitals/Pain Pain Assessment Pain Assessment: No/denies pain    Home Living Family/patient expects to be discharged to:: Skilled nursing facility                        Prior Function Prior Level of Function : Independent/Modified Independent;History of Falls (last six months)             Mobility Comments: mod I with rollator at ILF for all ADLs and self care tasks, family assist with IADLs ADLs Comments: pt is a poor historian and above information pertaining to PLOF and living obtained by notes.     Extremity/Trunk Assessment        Lower Extremity Assessment Lower Extremity Assessment: Generalized weakness    Cervical / Trunk Assessment Cervical / Trunk Assessment: Normal  Communication   Communication Communication: Impaired Factors Affecting Communication: Hearing impaired;Difficulty expressing self    Cognition Arousal: Alert Behavior During Therapy: WFL for tasks assessed/performed   PT - Cognitive impairments: History of cognitive impairments, No family/caregiver present to determine baseline, Memory, Attention, Sequencing, Safety/Judgement                       PT - Cognition Comments: pt required cues for safety, maintaining B UE support at RW and cues for inititation with increased time for all motor processing and planning Following commands: Impaired Following  commands impaired: Only follows one step commands consistently, Follows one step commands with increased time     Cueing Cueing Techniques: Verbal cues, Gestural cues, Tactile cues, Visual cues     General Comments General comments (skin integrity, edema, etc.): burise R thumb palmar surface, forehead laceration    Exercises     Assessment/Plan    PT Assessment Patient needs continued PT services  PT Problem List Decreased strength;Decreased activity tolerance;Decreased balance;Decreased mobility;Decreased coordination;Decreased cognition;Decreased safety awareness;Decreased knowledge of use of DME;Decreased knowledge of precautions       PT Treatment Interventions DME instruction;Gait training;Functional mobility training;Therapeutic activities;Therapeutic exercise;Balance training;Neuromuscular re-education;Cognitive remediation;Patient/family education    PT Goals (Current goals can be found in the Care Plan section)  Acute Rehab PT Goals PT Goal Formulation: Patient unable to participate in goal setting    Frequency Min 2X/week     Co-evaluation PT/OT/SLP Co-Evaluation/Treatment: Yes Reason for Co-Treatment: Complexity of the patient's impairments (multi-system involvement);Necessary to address cognition/behavior during functional activity;For patient/therapist safety PT goals addressed during  session: Mobility/safety with mobility;Balance;Proper use of DME OT goals addressed during session: ADL's and self-care;Proper use of Adaptive equipment and DME       AM-PAC PT 6 Clicks Mobility  Outcome Measure Help needed turning from your back to your side while in a flat bed without using bedrails?: A Little Help needed moving from lying on your back to sitting on the side of a flat bed without using bedrails?: A Little Help needed moving to and from a bed to a chair (including a wheelchair)?: A Little Help needed standing up from a chair using your arms (e.g., wheelchair or  bedside chair)?: A Little Help needed to walk in hospital room?: A Little Help needed climbing 3-5 steps with a railing? : Total 6 Click Score: 16    End of Session Equipment Utilized During Treatment: Gait belt Activity Tolerance: No increased pain;Patient tolerated treatment well Patient left: in chair;with call bell/phone within reach;with nursing/sitter in room Nurse Communication: Mobility status;Other (comment) (need to fix chair alarm and replace purewick if orders) PT Visit Diagnosis: Unsteadiness on feet (R26.81);Other abnormalities of gait and mobility (R26.89);Muscle weakness (generalized) (M62.81);History of falling (Z91.81);Difficulty in walking, not elsewhere classified (R26.2)    Time: 8972-8940 PT Time Calculation (min) (ACUTE ONLY): 32 min   Charges:   PT Evaluation $PT Eval Low Complexity: 1 Low   PT General Charges $$ ACUTE PT VISIT: 1 Visit         [place], PT Acute Rehab   [place] VEAR Drone 01/22/2024, 12:24 PM

## 2024-01-22 NOTE — Evaluation (Signed)
 Occupational Therapy Evaluation Patient Details Name: Melissa Fletcher MRN: [id number] DOB: [birth year] Today's Date: 01/22/2024   History of Present Illness   Pt is a 83 y.o. female presenting with AMS, hallucinations, UTI and fall on 01/20/2024. Pt was found down by daughters at [facility], [facility]. Pt PMH significant for recent diagnosis of Bell's Palsy & vascular dementia, OA, HTN, and R hip arthroplasty.     Clinical Impressions Pt admitted with above diagnosis. Pt is grossly A&Ox4, states she is from [facility] and is able to perform ADLs mod independent with use of RW, but question accuracy given functional status and dementia diagnosis. Pt HOH, requires increased time for processing and poor motor planning throughout. Pt requires MIN A for bed mobility and functional transfers, performed mobility using RW and MIN A to / from bathroom for toileting tasks, up to MAX A for posterior hygiene after continent BM on standard commode. UB dressing with MIN A. Pt demonstrates x2 LOB with poor balance, and requires multimodal cuing for hand placement on RW, poor carryover noted throughout. Pt would benefit from skilled OT services to address noted impairments and functional limitations (see below for any additional details) in order to maximize safety and independence while minimizing falls risk and caregiver burden. Anticipate the need for follow up OT services upon acute hospital DC. Patient will benefit from continued inpatient follow up therapy, <3 hours/day.     If plan is discharge home, recommend the following:   A lot of help with walking and/or transfers;A lot of help with bathing/dressing/bathroom;Assistance with cooking/housework;Direct supervision/assist for medications management;Direct supervision/assist for financial management;Assist for transportation;Help with stairs or ramp for entrance;Supervision due to cognitive status     Functional Status Assessment   Patient has had a recent decline in  their functional status and demonstrates the ability to make significant improvements in function in a reasonable and predictable amount of time.     Equipment Recommendations   None recommended by OT      Precautions/Restrictions   Precautions Precautions: Fall Restrictions Weight Bearing Restrictions Per Provider Order: No     Mobility Bed Mobility Overal bed mobility: Needs Assistance Bed Mobility: Supine to Sit     Supine to sit: Min assist, HOB elevated, Used rails     General bed mobility comments: increased time and cues, pt reaching for therapist to assist, pt exhibited R lateral and anterior LOB seated EOB    Transfers Overall transfer level: Needs assistance Equipment used: Rolling walker (2 wheels) Transfers: Sit to/from Stand Sit to Stand: Min assist           General transfer comment: MIN A for multiple STS transfers throughout session, pt with x2 LOB      Balance Overall balance assessment: History of Falls, Needs assistance Sitting-balance support: Feet supported Sitting balance-Leahy Scale: Fair Sitting balance - Comments: pt initally required min A for static sitting balance, cues for attention to midline and pt able to progress to CGA and cues Postural control: Right lateral lean (anterior) Standing balance support: Bilateral upper extremity supported, During functional activity, Reliant on assistive device for balance Standing balance-Leahy Scale: Poor Standing balance comment: LOB with gait and standing balance with B UE support                           ADL either performed or assessed with clinical judgement   ADL Overall ADL's : Needs assistance/impaired Eating/Feeding: Minimal assistance;Bed level;Sitting Eating/Feeding Details (indicate cue  type and reason): pt recieved in bed, observed to have difficulties locating cup of coffee on tray. OT provides minA to locate and open cup Grooming: Standing;Wash/dry face;Wash/dry  hands;Minimal assistance;Cueing for sequencing;Cueing for safety Grooming Details (indicate cue type and reason): sink level, pt requires cues for safety and for sequencing with posterior lean Upper Body Bathing: Sitting;Minimal assistance   Lower Body Bathing: Sit to/from stand;Maximal assistance   Upper Body Dressing : Sitting;Minimal assistance   Lower Body Dressing: Sit to/from stand;Maximal assistance   Toilet Transfer: Cueing for safety;Cueing for sequencing;Ambulation;Rolling walker (2 wheels);Regular Toilet;Grab bars Toilet Transfer Details (indicate cue type and reason): cues for hand placement, sequencing and RW mgmt throughout pivitoal steps to reg commode Toileting- Clothing Manipulation and Hygiene: Minimal assistance;Sit to/from stand;Maximal assistance Toileting - Clothing Manipulation Details (indicate cue type and reason): pt able to perform pericare after BM; requires MIN A for steadying assist, MAX A for thoroughness     Functional mobility during ADLs: Cueing for sequencing;Cueing for safety;Rolling walker (2 wheels);Minimal assistance General ADL Comments: pt found to be incontinent of urine in bed, ambulates to bathroom with +2 assist for safety due to posterior lean, requires cues for AD mgmt throughout,     Vision Baseline Vision/History: 1 Wears glasses Ability to See in Adequate Light: 0 Adequate Patient Visual Report: No change from baseline              Pertinent Vitals/Pain Pain Assessment Pain Assessment: No/denies pain     Extremity/Trunk Assessment Upper Extremity Assessment Upper Extremity Assessment: Generalized weakness   Lower Extremity Assessment Lower Extremity Assessment: Generalized weakness   Cervical / Trunk Assessment Cervical / Trunk Assessment: Normal   Communication Communication Communication: Impaired Factors Affecting Communication: Hearing impaired;Difficulty expressing self   Cognition Arousal: Alert Behavior During  Therapy: WFL for tasks assessed/performed Cognition: History of cognitive impairments             OT - Cognition Comments: grossly A&Ox4, pt HOH, slow processing, requires cues for safety awareness throughout, multiple times during session pt seemed to stare off into space and not respond                 Following commands: Impaired Following commands impaired: Only follows one step commands consistently, Follows one step commands with increased time     Cueing  General Comments   Cueing Techniques: Verbal cues;Gestural cues;Tactile cues;Visual cues  burise R thumb palmar surface, forehead laceration. VSS on RA.           Home Living Family/patient expects to be discharged to:: Skilled nursing facility                                        Prior Functioning/Environment Prior Level of Function : Independent/Modified Independent;History of Falls (last six months)             Mobility Comments: mod I with rollator at [facility]F for all ADLs and self care tasks, family assist with IADLs ADLs Comments: pt is a poor historian and above information pertaining to PLOF and living obtained by notes.    OT Problem List: Decreased strength;Decreased range of motion;Decreased activity tolerance;Impaired balance (sitting and/or standing);Decreased coordination;Decreased cognition;Decreased safety awareness;Decreased knowledge of use of DME or AE;Decreased knowledge of precautions   OT Treatment/Interventions: Self-care/ADL training;Therapeutic exercise;Neuromuscular education;Energy conservation;DME and/or AE instruction;Therapeutic activities;Cognitive remediation/compensation;Visual/perceptual remediation/compensation;Patient/family education;Balance training      OT  Goals(Current goals can be found in the care plan section)   Acute Rehab OT Goals OT Goal Formulation: Patient unable to participate in goal setting Time For Goal Achievement: 02/05/24 Potential  to Achieve Goals: Fair   OT Frequency:  Min 2X/week    Co-evaluation PT/OT/SLP Co-Evaluation/Treatment: Yes Reason for Co-Treatment: Complexity of the patient's impairments (multi-system involvement);Necessary to address cognition/behavior during functional activity;For patient/therapist safety PT goals addressed during session: Mobility/safety with mobility;Balance;Proper use of DME OT goals addressed during session: ADL's and self-care;Proper use of Adaptive equipment and DME      AM-PAC OT 6 Clicks Daily Activity     Outcome Measure Help from another person eating meals?: A Little Help from another person taking care of personal grooming?: A Little Help from another person toileting, which includes using toliet, bedpan, or urinal?: A Lot Help from another person bathing (including washing, rinsing, drying)?: A Lot Help from another person to put on and taking off regular upper body clothing?: A Little Help from another person to put on and taking off regular lower body clothing?: A Lot 6 Click Score: 15   End of Session Equipment Utilized During Treatment: Gait belt;Rolling walker (2 wheels) Nurse Communication: Mobility status  Activity Tolerance: Patient tolerated treatment well Patient left: in chair;with call bell/phone within reach;with bed alarm set  OT Visit Diagnosis: Unsteadiness on feet (R26.81);Repeated falls (R29.6);Muscle weakness (generalized) (M62.81);Other abnormalities of gait and mobility (R26.89);History of falling (Z91.81);Other symptoms and signs involving cognitive function                Time: 1027-1059 OT Time Calculation (min): 32 min Charges:  OT General Charges $OT Visit: 1 Visit OT Evaluation $OT Eval Low Complexity: 1 Low  Erionna Strum L. Cindel Daugherty, OTR/L  01/22/24, 1:10 PM

## 2024-01-23 DIAGNOSIS — F01B2 Vascular dementia, moderate, with psychotic disturbance: Secondary | ICD-10-CM | POA: Diagnosis not present

## 2024-01-23 LAB — BASIC METABOLIC PANEL WITH GFR
Anion gap: 9 (ref 5–15)
BUN: 15 mg/dL (ref 8–23)
CO2: 26 mmol/L (ref 22–32)
Calcium: 9.9 mg/dL (ref 8.9–10.3)
Chloride: 104 mmol/L (ref 98–111)
Creatinine, Ser: 0.58 mg/dL (ref 0.44–1.00)
GFR, Estimated: >60 mL/min (ref >60)
Glucose, Bld: 92 mg/dL (ref 70–99)
Potassium: 3.8 mmol/L (ref 3.5–5.1)
Sodium: 139 mmol/L (ref 135–145)

## 2024-01-23 LAB — CBC
HCT: 31.7 % — ABNORMAL LOW (ref 36.0–46.0)
Hemoglobin: 11.3 g/dL — ABNORMAL LOW (ref 12.0–15.0)
MCH: 37.2 pg — ABNORMAL HIGH (ref 26.0–34.0)
MCHC: 35.6 g/dL (ref 30.0–36.0)
MCV: 102.7 fL — ABNORMAL HIGH (ref 80.0–100.0)
Platelets: 74 K/uL — ABNORMAL LOW (ref 150–400)
RBC: 3.04 MIL/uL — ABNORMAL LOW (ref 3.87–5.11)
RDW: 15.9 % — ABNORMAL HIGH (ref 11.5–15.5)
WBC: 5.6 K/uL (ref 4.0–10.5)
nRBC: 0 % (ref 0.0–0.2)

## 2024-01-23 NOTE — Plan of Care (Signed)
  Problem: Clinical Measurements: Goal: Will remain free from infection Outcome: Progressing Goal: Diagnostic test results will improve Outcome: Progressing   Problem: Activity: Goal: Risk for activity intolerance will decrease Outcome: Progressing   Problem: Nutrition: Goal: Adequate nutrition will be maintained Outcome: Progressing

## 2024-01-23 NOTE — Progress Notes (Addendum)
 Physical Therapy Treatment Patient Details Name: Melissa Fletcher MRN: [id number] DOB: 02-Jan-1941 Today's Date: 01/23/2024   History of Present Illness Pt is a 83 y.o. female presenting with AMS, hallucinations, UTI and fall on 01/20/2024. Pt was found down by daughters at [facility], [facility]. Pt PMH significant for recent diagnosis of Bell's Palsy & vascular dementia, OA, HTN, and R hip arthroplasty.    PT Comments  AxO x 1 pleasant and willing.  Required repeat functional commands as well as increased time to process and express.  She did say [place] [facility] when asked about her current location. Improved cognition at end of session as she stated [facility] comes on at 12:30 and she wanted to watch.  Pt was OOB in recliner.  First assisted to [facility] due to urgency to void.  General transfer comment: Pt was unable to self rise from recliner and required + 2 Mod Assist with VC's to push up from pull up from walker.  Severe posterior lean with no self correction.  Unsteady with turns as Pt attempts to sit prior to completion.  VC's on safety.  Also assisted with a BSC transfer as well as peri care as Pt was unable to self perform and self balance.  HIGH FALL RISK. General Gait Details: limited amb distance of 24 feet requiring Mod Assist + 2 such that recliner was following.  Poor forward flexed posture and short shuffled steps.  VC's for upright posture and proper walker to self distance.  Severe LEFT lean.  Required assist to safely advance walker thru door way as well as with turns.  Poor ability to safely back step to recliner as she started to reach outside her base of support for recliner prior to completing steps.  HIGH FALL RISK.  Lpt has rec Pt will need ST Rehab at SNF to address mobility and functional decline prior to safely returning home alone at her [facility] the [employer].    If plan is discharge home, recommend the following:     Can travel by private vehicle      Yes  Equipment Recommendations  Rolling walker (2 wheels)    Recommendations for Other Services       Precautions / Restrictions Precautions Precautions: Fall Restrictions Weight Bearing Restrictions Per Provider Order: No     Mobility  Bed Mobility               General bed mobility comments: OOB in recliner    Transfers Overall transfer level: Needs assistance Equipment used: Rolling walker (2 wheels) Transfers: Sit to/from Stand, Bed to chair/wheelchair/BSC Sit to Stand: Mod assist, +2 physical assistance           General transfer comment: Pt was unable to self rise from recliner and required + 2 Mod Assist with VC's to push up from pull up from walker.  Severe posterior lean with no self correction.  Unsteady with turns as Pt attempts to sit prior to completion.  VC's on safety.  Also assisted with a BSC transfer as well as peri care as Pt was unable to self perform and self balance.  HIGH FALL RISK.    Ambulation/Gait Ambulation/Gait assistance: Min assist, Mod assist, +2 safety/equipment Gait Distance (Feet): 24 Feet Assistive device: Rolling walker (2 wheels)   Gait velocity: decreased     General Gait Details: limited amb distance of 24 feet requiring Mod Assist + 2 such that recliner was following.  Poor forward flexed posture  and short shuffled steps.  VC's for upright posture and proper walker to self distance.  Severe LEFT lean.  Required assist to safely advance walker thru door way as well as with turns.  Poor ability to safely back step to recliner as she started to reach outside her base of support for recliner prior to completing steps.  HIGH FALL RISK.   Stairs             Wheelchair Mobility     Tilt Bed    Modified Rankin (Stroke Patients Only)       Balance                                            Communication Communication Communication: Impaired Factors Affecting Communication: Hearing  impaired;Difficulty expressing self  Cognition Arousal: Alert     PT - Cognitive impairments: History of cognitive impairments, No family/caregiver present to determine baseline, Memory, Attention, Sequencing, Safety/Judgement                       PT - Cognition Comments: AxO x 1 pleasant and willing.  Required repeat functional commands as well as increased time to process and express.  She did say [place] [facility] when asked about her current location. Following commands: Impaired Following commands impaired: Only follows one step commands consistently, Follows one step commands with increased time    Cueing Cueing Techniques: Verbal cues, Gestural cues, Tactile cues, Visual cues  Exercises      General Comments        Pertinent Vitals/Pain Pain Assessment Pain Assessment: No/denies pain    Home Living                          Prior Function            PT Goals (current goals can now be found in the care plan section) Progress towards PT goals: Progressing toward goals    Frequency    Min 2X/week      PT Plan      Co-evaluation              AM-PAC PT 6 Clicks Mobility   Outcome Measure  Help needed turning from your back to your side while in a flat bed without using bedrails?: A Lot Help needed moving from lying on your back to sitting on the side of a flat bed without using bedrails?: A Lot Help needed moving to and from a bed to a chair (including a wheelchair)?: A Lot Help needed standing up from a chair using your arms (e.g., wheelchair or bedside chair)?: A Lot Help needed to walk in hospital room?: A Lot Help needed climbing 3-5 steps with a railing? : Total 6 Click Score: 11    End of Session Equipment Utilized During Treatment: Gait belt Activity Tolerance: Patient tolerated treatment well Patient left: in chair;with call bell/phone within reach;with nursing/sitter in room Nurse Communication: Mobility status        Time: 8845-8781 PT Time Calculation (min) (ACUTE ONLY): 24 min  Charges:    $Gait Training: 8-22 mins $Therapeutic Activity: 8-22 mins PT General Charges $$ ACUTE PT VISIT: 1 Visit                     Katheryn Leap  PTA Acute  Rehabilitation Services Office M-F          [phone]

## 2024-01-23 NOTE — Plan of Care (Signed)

## 2024-01-23 NOTE — Progress Notes (Signed)
 Occupational Therapy Treatment Patient Details Name: Melissa Fletcher MRN: [id number] DOB: 12/19/1940 Today's Date: 01/23/2024   History of present illness Pt is a 83 y.o. female presenting with AMS, hallucinations, UTI and fall on 01/20/2024. Pt was found down by daughters at [facility], [place]. Pt PMH significant for recent diagnosis of Bell's Palsy & vascular dementia, OA, HTN, and R hip arthroplasty.   OT comments  Pt seen for OT treatment session, pt continues to present with deficits in strength, balance, coordination, cognition and safety awareness which impact safe, efficient ADL + mobility performance. Pt requires MIN A for bed mobility, STS transfers and step pivot from bed to recliner using RW. Setup provided with verbal cues to orient pt to items on breakfast tray - once setup given, pt is able to coordinate use of utensils for self-feeding. Discharge recommendation remains appropriate, OT will continue to follow acutely.       If plan is discharge home, recommend the following:  A lot of help with walking and/or transfers;A lot of help with bathing/dressing/bathroom;Assistance with cooking/housework;Direct supervision/assist for medications management;Direct supervision/assist for financial management;Assist for transportation;Help with stairs or ramp for entrance;Supervision due to cognitive status   Equipment Recommendations  None recommended by OT       Precautions / Restrictions Precautions Precautions: Fall Recall of Precautions/Restrictions: Impaired Restrictions Weight Bearing Restrictions Per Provider Order: No       Mobility Bed Mobility Overal bed mobility: Needs Assistance Bed Mobility: Supine to Sit     Supine to sit: Min assist, HOB elevated, Used rails     General bed mobility comments: increased time, HOB elevated, pt reaching for therapist's hand and requires assist to exit R side of bed with step-by-step cuing    Transfers Overall transfer level: Needs  assistance Equipment used: Rolling walker (2 wheels) Transfers: Sit to/from Stand, Bed to chair/wheelchair/BSC Sit to Stand: Min assist     Step pivot transfers: Min assist     General transfer comment: pt requires step by step cues for both feet placement, hand placement and assist to power up to standing. Pt able to take small, pivotal steps towards recliner with MIN A and posterior bias; poor proximity to AD requiring therapist to manage device, and abruptly sits end of transfer. Cues to scoot hips backwards.     Balance Overall balance assessment: History of Falls, Needs assistance Sitting-balance support: Feet supported Sitting balance-Leahy Scale: Fair Sitting balance - Comments: pt initally with heavy posterior lean, maxA to correct with multimodal cues for anterior weight shifting. with time, pt progresses to CGA for static sitting balance with BUE/LE support.   Standing balance support: Bilateral upper extremity supported, During functional activity, Reliant on assistive device for balance Standing balance-Leahy Scale: Poor Standing balance comment: minimal tolerance to standing; requires hands-on assist at all times due to poor safety awareness, difficulties motor planning and poor carryover of technique                           ADL either performed or assessed with clinical judgement   ADL Overall ADL's : Needs assistance/impaired Eating/Feeding: Sitting;Set up Eating/Feeding Details (indicate cue type and reason): OT sets up breakfast tray, orients pt to all items, pt asking what food items are, once tray setup, pt is able to coordinate utensil use to feed self grits  Functional mobility during ADLs: Cueing for sequencing;Cueing for safety;Rolling walker (2 wheels);Minimal assistance General ADL Comments: Session focused on functional mobility and transfer traning     Communication Communication Communication:  Impaired Factors Affecting Communication: Hearing impaired;Difficulty expressing self   Cognition Arousal: Alert Behavior During Therapy: WFL for tasks assessed/performed Cognition: History of cognitive impairments             OT - Cognition Comments: pt with overall flat affect, increased time to process commands and poor carryover vs prior session                 Following commands: Impaired Following commands impaired: Only follows one step commands consistently, Follows one step commands with increased time      Cueing   Cueing Techniques: Verbal cues, Gestural cues, Tactile cues, Visual cues             Pertinent Vitals/ Pain       Pain Assessment Pain Assessment: No/denies pain   Frequency  Min 2X/week        Progress Toward Goals  OT Goals(current goals can now be found in the care plan section)  Progress towards OT goals: Progressing toward goals  Acute Rehab OT Goals OT Goal Formulation: Patient unable to participate in goal setting Time For Goal Achievement: 02/05/24 Potential to Achieve Goals: Fair ADL Goals Pt Will Perform Grooming: sitting;with set-up Pt Will Perform Upper Body Dressing: with set-up;sitting Pt Will Perform Lower Body Dressing: sit to/from stand;with contact guard assist Pt Will Transfer to Toilet: with contact guard assist;ambulating;grab bars Pt Will Perform Toileting - Clothing Manipulation and hygiene: with contact guard assist;sit to/from stand;sitting/lateral leans  Plan         AM-PAC OT 6 Clicks Daily Activity     Outcome Measure   Help from another person eating meals?: A Little Help from another person taking care of personal grooming?: A Little Help from another person toileting, which includes using toliet, bedpan, or urinal?: A Lot Help from another person bathing (including washing, rinsing, drying)?: A Lot Help from another person to put on and taking off regular upper body clothing?: A Little Help from  another person to put on and taking off regular lower body clothing?: A Lot 6 Click Score: 15    End of Session Equipment Utilized During Treatment: Gait belt;Rolling walker (2 wheels)  OT Visit Diagnosis: Unsteadiness on feet (R26.81);Repeated falls (R29.6);Muscle weakness (generalized) (M62.81);Other abnormalities of gait and mobility (R26.89);History of falling (Z91.81);Other symptoms and signs involving cognitive function   Activity Tolerance Patient tolerated treatment well   Patient Left in chair;with call bell/phone within reach;with bed alarm set   Nurse Communication Mobility status        Time: 9145-9094 OT Time Calculation (min): 11 min  Charges: OT General Charges $OT Visit: 1 Visit OT Treatments $Self Care/Home Management : 8-22 mins Necha Harries L. Celie Desrochers, OTR/L  01/23/24, 9:28 AM

## 2024-01-23 NOTE — TOC Progression Note (Addendum)
 Transition of Care [facility]) - Progression Note    Patient Details  Name: Melissa Fletcher MRN: [id number] Date of Birth: 04/26/1941  Transition of Care [facility]) CM/SW Contact  Doneta Glenys DASEN, RN Phone Number: 01/23/2024, 9:38 AM  Clinical Narrative:    CM presented the list for choice to [name] (dlt). [address]       Service Provider Request Status STAR(s) Address Phone Patient Preferred  [facility]  Accepted 1 [address] [place] [address] [phone]   [facility]  Accepted 2 [address] [place] [address] [phone]   [facility]  Accepted 1 [address] [place] [address] [phone]   [facility]  Considering [address] [place] [address] [phone]   12:03 PM [place] [facility] requested additional documents H&P for 01/22/2024. Upload to [facility]. 3:02 PM CM called patients daughter [name]. [name] visited [facility] and was informed that they did not have a bed. We discussed expaning the referral search to surrounding area. CM sent referrals out to [place], [place], [facility], [facility]. 5:24 PM Patients daughter [name] selected [facility] in [place]. Message sent to [facility] to see if a bed is available.  CM sent email to [email or web address] to see if patient will qualify for admission without a 3 day inpatient stay.  Expected Discharge Plan: Assisted Living Barriers to Discharge: Continued Medical Work up               Expected Discharge Plan and Services In-house Referral: NA Discharge Planning Services: CM Consult Post Acute Care Choice: NA Living arrangements for the past 2 months: Independent Living Facility                 DME Arranged: N/A DME Agency: NA       HH Arranged: NA HH Agency: NA         Social Drivers of Health (SDOH) Interventions SDOH Screenings   Food Insecurity: No Food Insecurity  (01/21/2024)  Housing: Low Risk  (01/21/2024)  Transportation Needs: No Transportation Needs (01/21/2024)  Utilities: Not At Risk (01/21/2024)  Alcohol Screen: Low Risk  (09/08/2021)  Depression (PHQ2-9): Low Risk  (09/20/2023)  Financial Resource Strain: Low Risk  (09/08/2021)  Physical Activity: Inactive (09/08/2021)  Social Connections: Socially Isolated (01/21/2024)  Stress: No Stress Concern Present (09/08/2021)  Tobacco Use: Medium Risk (01/21/2024)    Readmission Risk Interventions     No data to display
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
## 2024-01-24 DIAGNOSIS — F01B2 Vascular dementia, moderate, with psychotic disturbance: Secondary | ICD-10-CM | POA: Diagnosis not present

## 2024-01-24 DIAGNOSIS — F05 Delirium due to known physiological condition: Secondary | ICD-10-CM | POA: Diagnosis not present

## 2024-01-24 LAB — BASIC METABOLIC PANEL WITH GFR
Anion gap: 8 (ref 5–15)
BUN: 13 mg/dL (ref 8–23)
CO2: 28 mmol/L (ref 22–32)
Calcium: 10 mg/dL (ref 8.9–10.3)
Chloride: 103 mmol/L (ref 98–111)
Creatinine, Ser: 0.54 mg/dL (ref 0.44–1.00)
GFR, Estimated: >60 mL/min (ref >60)
Glucose, Bld: 93 mg/dL (ref 70–99)
Potassium: 3.9 mmol/L (ref 3.5–5.1)
Sodium: 139 mmol/L (ref 135–145)

## 2024-01-24 LAB — CBC
HCT: 37.5 % (ref 36.0–46.0)
Hemoglobin: 12 g/dL (ref 12.0–15.0)
MCH: 30.9 pg (ref 26.0–34.0)
MCHC: 32 g/dL (ref 30.0–36.0)
MCV: 96.6 fL (ref 80.0–100.0)
Platelets: 75 K/uL — ABNORMAL LOW (ref 150–400)
RBC: 3.88 MIL/uL (ref 3.87–5.11)
RDW: 13.1 % (ref 11.5–15.5)
WBC: 6.5 K/uL (ref 4.0–10.5)
nRBC: 0 % (ref 0.0–0.2)

## 2024-01-24 LAB — CK: Total CK: 139 U/L (ref 38–234)
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
## 2024-01-24 NOTE — TOC Progression Note (Signed)
 Transition of Care [facility]) - Progression Note    Patient Details  Name: Melissa Fletcher MRN: [id number] Date of Birth: [birth year]  Transition of Care [facility]) CM/SW Contact  Doneta Glenys DASEN, RN Phone Number: 01/24/2024, 9:09 AM  Clinical Narrative:    Choice List 1540, [place]       Service Provider Request Status Services Address Phone Patient Preferred  [facility] AND [facility] SNF   Selected Skilled Nursing 2 [address] [place] [address] [phone]   [facility] SNF  Accepted 2 [address] [place] [address] [phone]   [facility] SN[facility]  Accepted 1 [address] [place] [address] [phone]   [facility] SNF  Accepted 1 [address] [place] [address] [phone]   [facility] Preferred SNF  Accepted 2 [address], [place] [address] [phone]   [facility] Preferred SNF  Accepted 4 [address], [place] [address] [phone]   [facility]  Accepted 2 [address], [place] [address] [phone]   [facility] SNF  Accepted 2 [address] [place] [address] [phone]   [facility]  Accepted 2 [address] [place] [address] [phone]   [facility] Preferred SNF  Accepted 1 [address] [place] [address] [phone]   CM will call   Expected Discharge Plan: Assisted Living Barriers to Discharge: Continued Medical Work up               Expected Discharge Plan and Services In-house Referral: NA Discharge Planning Services: CM Consult Post Acute Care Choice: NA Living arrangements for the past 2 months: Independent Living Facility                 DME Arranged: N/A DME Agency: NA       HH Arranged: NA HH Agency: NA         Social Drivers of Health  (SDOH) Interventions SDOH Screenings   Food Insecurity: No Food Insecurity (01/21/2024)  Housing: Low Risk  (01/21/2024)  Transportation Needs: No Transportation Needs (01/21/2024)  Utilities: Not At Risk (01/21/2024)  Alcohol Screen: Low Risk  (09/08/2021)  Depression (PHQ2-9): Low Risk  (09/20/2023)  Financial Resource Strain: Low Risk  (09/08/2021)  Physical Activity: Inactive (09/08/2021)  Social Connections: Socially Isolated (01/21/2024)  Stress: No Stress Concern Present (09/08/2021)  Tobacco Use: Medium Risk (01/21/2024)    Readmission Risk Interventions     No data to display

## 2024-01-24 NOTE — Plan of Care (Signed)
   Problem: Health Behavior/Discharge Planning: Goal: Ability to manage health-related needs will improve Outcome: Progressing   Problem: Clinical Measurements: Goal: Ability to maintain clinical measurements within normal limits will improve Outcome: Progressing Goal: Will remain free from infection Outcome: Progressing Goal: Diagnostic test results will improve Outcome: Progressing

## 2024-01-24 NOTE — Plan of Care (Signed)

## 2024-01-25 DIAGNOSIS — F01B0 Vascular dementia, moderate, without behavioral disturbance, psychotic disturbance, mood disturbance, and anxiety: Secondary | ICD-10-CM

## 2024-01-25 MED ORDER — TRAMADOL HCL 50 MG PO TABS: ORAL_TABLET | ORAL | 0 refills | Status: AC

## 2024-01-25 NOTE — TOC Transition Note (Signed)
 Transition of Care [facility]) - Discharge Note   Patient Details  Name: Melissa Fletcher MRN: [id number] Date of Birth: 13-Mar-1941  Transition of Care [facility]) CM/SW Contact:  Doneta Glenys DASEN, RN Phone Number: 01/25/2024, 5:52 PM   Clinical Narrative:    Patient discharge to [facility]. Patient, Patients daughter [name], SNF and nurse aware. Nurse will call report to [phone] Rm 143 prior to discharge. Discharge packet includes facesheet, medical necessity, signed DNR and 1 prescription on shadow chart. [facility] called for transport.   Final next level of care: Skilled Nursing Facility Barriers to Discharge: Barriers Resolved   Patient Goals and CMS Choice Patient states their goals for this hospitalization and ongoing recovery are:: [facility] CMS Medicare.gov Compare Post Acute Care list provided to:: Patient Represenative (must comment) ([name]) Choice offered to / list presented to : Adult Children ([name]) [place] ownership interest in [facility].provided to:: Parent NA    Discharge Placement              Patient chooses bed at: [facility] Patient to be transferred to facility by: [facility] Name of family member notified: [name] Patient and family notified of of transfer: 01/25/24  Discharge Plan and Services Additional resources added to the After Visit Summary for   In-house Referral: NA Discharge Planning Services: CM Consult Post Acute Care Choice: NA          DME Arranged: N/A DME Agency: NA       HH Arranged: NA HH Agency: NA        Social Drivers of Health (SDOH) Interventions SDOH Screenings   Food Insecurity: No Food Insecurity (01/21/2024)  Housing: Low Risk  (01/21/2024)  Transportation Needs: No Transportation Needs (01/21/2024)  Utilities: Not At Risk (01/21/2024)  Alcohol Screen: Low Risk  (09/08/2021)  Depression (PHQ2-9): Low Risk  (09/20/2023)  Financial Resource Strain: Low Risk  (09/08/2021)  Physical Activity: Inactive  (09/08/2021)  Social Connections: Socially Isolated (01/21/2024)  Stress: No Stress Concern Present (09/08/2021)  Tobacco Use: Medium Risk (01/21/2024)     Readmission Risk Interventions     No data to display

## 2024-01-25 NOTE — Plan of Care (Signed)
 Report given to [place], for room 143 at [facility], phone number [phone] .
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
## 2024-01-26 NOTE — ED Provider Notes (Signed)
 Emergency Department Provider Note  Provider at bedside: 01/26/2024 3:57 PM  History obtained from the: EMS  History unable to be obtained from patient due to history of dementia.   History   Chief Complaint  Patient presents with  . Fall   HPI  Pt is a 83 y.o. female with history of dementia who presents via EMS from [facility] facility with complaints of injuries sustained during a fall that occurred just prior to arrival.  Per EMS, patient stood up and fell backwards hitting her head on an unknown object.  The fall was witnessed and patient did not lose consciousness.  Patient is not on blood thinners.  Patient is at her baseline per EMS per facility staff.  No LMP recorded.  ROS: Pertinent positives and negatives per HPI.  Past Medical History Medical History[1]  Past Surgical History Surgical History[2]  Medications These were reviewed. See nursing note for details.  Allergies Patient has no known allergies.  Family History Family History[3]  Social History Social History[4]  All pertinent family history, social history and PMHx including medical, surgical, current medications and allergies were reviewed by myself. See nursing note for details.   Physical Exam  I have reviewed the following vital signs: BP 146/66 (BP Location: Left arm, Patient Position: Lying)   Pulse (!) 112   Temp 98.4 F (36.9 C) (Oral)   Resp 19   SpO2 92%   Physical Exam Vitals and nursing note reviewed.  Constitutional:      General: She is not in acute distress.    Appearance: She is well-developed.  HENT:     Head: Laceration (3 cm stellate laceration to the posterior scalp) present. No raccoon eyes or Battle's sign.     Right Ear: No hemotympanum.     Left Ear: No hemotympanum.     Nose: Nose normal.     Mouth/Throat:     Mouth: Mucous membranes are moist. No injury.  Eyes:     Pupils: Pupils are equal, round, and reactive to light.  Cardiovascular:     Rate and  Rhythm: Normal rate and regular rhythm.     Heart sounds: Normal heart sounds.  Pulmonary:     Effort: Pulmonary effort is normal. No respiratory distress.     Breath sounds: Normal breath sounds. No wheezing, rhonchi or rales.  Chest:     Chest wall: No tenderness.  Abdominal:     Palpations: Abdomen is soft.     Tenderness: There is no abdominal tenderness.  Musculoskeletal:     Cervical back: Normal range of motion and neck supple. No spinous process tenderness or muscular tenderness.     Comments: Patient is moving all 4 extremities freely.  She does not appear to have any tenderness to palpation over all large joints.  Skin:    General: Skin is warm and dry.  Neurological:     Mental Status: She is alert. Mental status is at baseline.  Psychiatric:        Behavior: Behavior is agitated.     Results  RADIOLOGY Radiology Results (last 72 hours)     Procedure Component Value Units Date/Time   CT Head WO Contrast [[id number]] Collected: 01/26/24 1434   Order Status: Completed Updated: 01/26/24 1631   Narrative:     CT HEAD WITHOUT CONTRAST, 01/26/2024 2:34 PM  INDICATION: fall  COMPARISON: None  TECHNIQUE: Axial CT images of the brain from skull base to vertex, including portions of the face and sinuses,  were obtained without contrast. Supplemental 2D reformatted images were generated and reviewed as needed.  All CT scans at [facility] and [facility] are performed using radiation dose optimization techniques as appropriate to a performed exam, including but not limited to one or more of the following: automatic exposure control, adjustment of the mA and/or kV according to patient size, use of iterative reconstruction technique. In addition, our institution participates in a radiation dose monitoring program to optimize patient radiation exposure.  FINDINGS: Calvarium/skull base: No evidence of acute fracture or destructive lesion.  Mastoids and middle ears demonstrate no substantial mucosal disease. Left parietal scalp contusion. Bilateral pseudophakia.  Paranasal sinuses: Fluid in the left sphenoid sinus.  Brain: No acute large vascular territory infarct. No mass effect. No hydrocephalus. No acute hemorrhage. Patchy, confluent periventricular and subcortical white matter hypoattenuations, likely reflective of chronic microvascular ischemic changes. Global cerebral volume loss with ex-vacuo dilatation of the lateral ventricles. Intracranial atherosclerosis.    Impression:      1.  No acute intracranial abnormality.  2.  Left parietal scalp contusion without underlying calvarial fracture.    CT Spine Cervical WO Contrast [[id number]] Collected: 01/26/24 1437   Order Status: Completed Updated: 01/26/24 1636   Narrative:     CT CERVICAL SPINE WITHOUT CONTRAST, 01/26/2024 2:34 PM  INDICATION: fall   COMPARISON: None  TECHNIQUE: Thin-section axial CT images of the entire cervical spine were acquired without contrast. Supplemental 2D reformatted images were generated and reviewed as needed.  All CT scans at [facility] and [facility] are performed using radiation dose optimization techniques as appropriate to a performed exam, including but not limited to one or more of the following: automatic exposure control, adjustment of the mA and/or kV according to patient size, use of iterative reconstruction technique. In addition, our institution participates in a radiation dose monitoring program to optimize patient radiation exposure.  LEVELS IMAGED: Foramen magnum to upper thoracic region.  FINDINGS: Alignment: No acute traumatic malalignment. Mild stepwise anterolisthesis of C3 on C4, C4 on C5, C5-C6, likely degenerative.  Craniocervical junction: No evidence of acute fracture or dislocation. Odontoid pannus results in mild canal stenosis at C1.  Vertebrae: No acute fractures.  Vertebral body heights maintained. Osteopenia.  Degenerative changes: No high-grade canal stenosis. Posterior disc osteophyte complexes spanning C3-C7 which result in mild multilevel canal stenosis. Moderate degenerative disc disease at C6-C7, mild at the remaining levels. Varying degrees of uncovertebral joint and facet arthropathy resulting in multilevel foraminal stenosis, most pronounced and advanced on the right at C3-C4.  Other: Bilateral carotid bifurcation calcifications. Atrophic thyroid .    Impression:     No evidence of acute fracture or traumatic malalignment of the cervical spine.      Imaging reviewed by myself and considered in medical decision making. Imaging final read interpreted by radiology. Please see below for more detail.  Procedure Note  Laceration repair  Date/Time: 01/26/2024 1:49 PM  Performed by: Leonor Anette Hope, PA-C Authorized by: Paulina Earnie Easterly, MD   Consent:    Consent obtained:  Emergent situation   Consent given by:  Patient   Risks discussed:  Pain   Alternatives discussed:  No treatment Universal protocol:    Procedure explained and questions answered to patient or proxy's satisfaction: yes     Imaging studies available: yes     Patient identity confirmed:  Verbally with patient and arm band Anesthesia:    Anesthesia method:  None Laceration details:    Location:  Scalp   Scalp location:  Occipital   Length (cm):  3 Pre-procedure details:    Preparation:  Imaging obtained to evaluate for foreign bodies Exploration:    Limited defect created (wound extended): no     Hemostasis achieved with:  Direct pressure   Imaging outcome: foreign body not noted     Wound exploration: entire depth of wound visualized     Wound extent: no foreign bodies/material noted and no underlying fracture noted     Contaminated: no   Treatment:    Area cleansed with:  Saline   Amount of cleaning:  Standard   Irrigation solution:  Sterile water    Irrigation method:  Syringe   Debridement:  None   Undermining:  None   Scar revision: no   Skin repair:    Repair method:  Staples   Number of staples:  4 Approximation:    Approximation:  Close Repair type:    Repair type:  Simple Post-procedure details:    Dressing:  Antibiotic ointment   Procedure completion:  Tolerated well, no immediate complications   Medical Decision Making  ED Course    Initial Differential Diagnoses: The following diagnoses were considered, but not limited to, in determining workup and treatments: SDH, SAH, epidural hematoma, laceration, intracerebral hematoma, contusion, bruising  Labs/Imaging/medications/other testing ordered due to concerns discussed in history of and physical exam include: CT head and cervical spine  Therapies: These medications and interventions were provided for the patient while in the ED. Medications  bacitracin ointment tube 1 Application (1 Application topical Given 01/26/24 1650)   Parenteral narcotics ordered: No  Drug Therapy requiring intensive monitoring ordered: No  Testing Results: none  Imaging Results: My interpretation of CT scan shows No acute findings. Final result pending radiology read.  I dicussed imaging finding with provider: No.   I reviewed medical record from Outside Hospital visit showing patient was admitted at [facility] ED 01/21/2024 to 01/25/2024 for altered mental status.  The following consultations were required: none  Clinical Complexity  Patient's presentation is most consistent with acute presentation with potential threat to life or bodily function.  Decision to admit or increased level of care requiring transfer: While I initially considered hospital admission, based on reassuring ED work-up and response to treatment, I feel that patient is appropriate for discharge home with follow-up.  Strict return precautions regarding this ED visit were discussed.  OTC medications advised and  discussed with patient include none.  Prescription medications discussed: none  Discussed options for workup with patient including risks and benefits via shared decision making and decided to proceed with workup.  ED Assessment/Plan and Clinical Impression  Pt is a 83 y.o. female who presented to the ED with complaints of injuries sustained during a mechanical fall that occurred just prior to arrival. Remainder of history and physical exam are as detailed above. Patient was afebrile with stable vital signs.  On exam, patient has a laceration to her posterior scalp with bleeding controlled.  There are no obvious foreign bodies.  She is otherwise diffusely nontender to palpation and does not appear to have any other injuries.  CT scans were done for further evaluation and showed no underlying fracture, intracranial hemorrhage, or other emergent issue.  There is no indication for further workup or admission from an emergent standpoint.  Her laceration was repaired per the procedure note above.  Patient tolerated the procedure well.  Patient is stable  for discharge home to her facility.  Paperwork will include wound care instructions.  Patient will need to follow-up in about 1 week for staple removal.  ED return precautions provided.  Patient was stable at the time of discharge.  Clinical Impression 1. Fall, initial encounter   2. Laceration of scalp, initial encounter      Medication List     ASK your doctor about these medications    atenoloL  50 mg tablet Commonly known as: TENORMIN  Take 50 mg by mouth Once Daily.   furosemide  20 mg tablet Commonly known as: LASIX  TAKE 1 TABLET(20 MG) BY MOUTH DAILY AS NEEDED FOR ANKLE SWELLING   gabapentin  100 mg capsule Commonly known as: NEURONTIN    potassium chloride  10 mEq ER tablet Commonly known as: KLOR-CON  TAKE 1 TABLET(10 MEQ) BY MOUTH DAILY WITH FUROSEMIDE  AS NEEDED   traMADoL  50 mg tablet Commonly known as: ULTRAM  Take 1 tablet by mouth  Once Daily.   valsartan  160 mg tablet Commonly known as: DIOVAN  Take 1 tablet by mouth Once Daily.       FOLLOW UP Lamarr Jackquline Rotunda, MD [address] [place] [address] [phone]  Schedule an appointment as soon as possible for a visit    [facility] -  EMERGENCY DEPARTMENT [address] [place]  [address] [phone]  As needed, If symptoms worsen        [1] Past Medical History: Diagnosis Date  . Hearing loss   . Hypertension   [2] No past surgical history on file. [3] No family history on file. [4] Social History Tobacco Use  . Smoking status: Former    Types: Cigarettes  . Smokeless tobacco: Never  Vaping Use  . Vaping status: Never Used  Substance Use Topics  . Alcohol use: Not Currently  . Drug use: Never

## 2024-02-08 ENCOUNTER — Telehealth: Payer: Self-pay | Admitting: Internal Medicine

## 2024-02-10 ENCOUNTER — Other Ambulatory Visit: Payer: Self-pay | Admitting: Orthopedic Surgery

## 2024-02-10 DIAGNOSIS — I10 Essential (primary) hypertension: Secondary | ICD-10-CM

## 2024-02-21 ENCOUNTER — Encounter: Admitting: Orthopedic Surgery

## 2024-02-21 ENCOUNTER — Encounter: Payer: Self-pay | Admitting: Orthopedic Surgery

## 2024-02-25 ENCOUNTER — Ambulatory Visit: Admitting: Internal Medicine

## 2024-02-25 ENCOUNTER — Other Ambulatory Visit

## 2024-02-25 NOTE — Progress Notes (Signed)
 This encounter was created in error - please disregard.

## 2024-03-17 ENCOUNTER — Other Ambulatory Visit: Payer: Self-pay | Admitting: Orthopedic Surgery

## 2024-03-17 DIAGNOSIS — F01B3 Vascular dementia, moderate, with mood disturbance: Secondary | ICD-10-CM

## 2024-03-17 NOTE — Telephone Encounter (Signed)
 States pt medication was discontinued. Routing to pts pcp for further review.

## 2024-03-24 ENCOUNTER — Encounter: Payer: Self-pay | Admitting: Radiology

## 2024-04-03 ENCOUNTER — Other Ambulatory Visit: Payer: Self-pay | Admitting: Orthopedic Surgery

## 2024-04-03 DIAGNOSIS — M25473 Effusion, unspecified ankle: Secondary | ICD-10-CM
